# Patient Record
Sex: Female | Born: 1980 | Race: Black or African American | Hispanic: No | Marital: Single | Smoking: Light tobacco smoker
Health system: Southern US, Community
[De-identification: ages and names within clinical notes are randomized; demographics above are authoritative.]

## PROBLEM LIST (undated history)

## (undated) DIAGNOSIS — F319 Bipolar disorder, unspecified: Secondary | ICD-10-CM

## (undated) DIAGNOSIS — F209 Schizophrenia, unspecified: Secondary | ICD-10-CM

---

## 2008-07-04 ENCOUNTER — Emergency Department: Payer: Self-pay | Admitting: Emergency Medicine

## 2014-02-13 ENCOUNTER — Emergency Department: Payer: Self-pay | Admitting: Student

## 2014-02-13 LAB — COMPREHENSIVE METABOLIC PANEL
ANION GAP: 12 (ref 7–16)
Albumin: 4 g/dL (ref 3.4–5.0)
Alkaline Phosphatase: 55 U/L
BUN: 5 mg/dL — ABNORMAL LOW (ref 7–18)
Bilirubin,Total: 0.8 mg/dL (ref 0.2–1.0)
CHLORIDE: 101 mmol/L (ref 98–107)
Calcium, Total: 8.8 mg/dL (ref 8.5–10.1)
Co2: 24 mmol/L (ref 21–32)
Creatinine: 1 mg/dL (ref 0.60–1.30)
EGFR (African American): >60
GLUCOSE: 92 mg/dL (ref 65–99)
Osmolality: 271 (ref 275–301)
Potassium: 3.3 mmol/L — ABNORMAL LOW (ref 3.5–5.1)
SGOT(AST): 46 U/L — ABNORMAL HIGH (ref 15–37)
SGPT (ALT): 55 U/L
SODIUM: 137 mmol/L (ref 136–145)
Total Protein: 8.3 g/dL — ABNORMAL HIGH (ref 6.4–8.2)

## 2014-02-13 LAB — CBC
HCT: 42.3 % (ref 35.0–47.0)
HGB: 13.9 g/dL (ref 12.0–16.0)
MCH: 33.5 pg (ref 26.0–34.0)
MCHC: 32.8 g/dL (ref 32.0–36.0)
MCV: 102 fL — AB (ref 80–100)
Platelet: 320 10*3/uL (ref 150–440)
RBC: 4.14 10*6/uL (ref 3.80–5.20)
RDW: 14.7 % — AB (ref 11.5–14.5)
WBC: 9.7 10*3/uL (ref 3.6–11.0)

## 2014-02-13 LAB — SALICYLATE LEVEL: Salicylates, Serum: 3.2 mg/dL — ABNORMAL HIGH

## 2014-02-13 LAB — ACETAMINOPHEN LEVEL

## 2014-02-13 LAB — ETHANOL: Ethanol: <3 mg/dL

## 2014-02-14 LAB — DRUG SCREEN, URINE
Amphetamines, Ur Screen: NEGATIVE (ref ?–1000)
BENZODIAZEPINE, UR SCRN: NEGATIVE (ref ?–200)
Barbiturates, Ur Screen: NEGATIVE (ref ?–200)
Cannabinoid 50 Ng, Ur ~~LOC~~: POSITIVE (ref ?–50)
Cocaine Metabolite,Ur ~~LOC~~: NEGATIVE (ref ?–300)
MDMA (ECSTASY) UR SCREEN: NEGATIVE (ref ?–500)
METHADONE, UR SCREEN: NEGATIVE (ref ?–300)
OPIATE, UR SCREEN: NEGATIVE (ref ?–300)
Phencyclidine (PCP) Ur S: NEGATIVE (ref ?–25)
Tricyclic, Ur Screen: NEGATIVE (ref ?–1000)

## 2014-04-07 LAB — COMPREHENSIVE METABOLIC PANEL
ALK PHOS: 57 U/L
Albumin: 4 g/dL (ref 3.4–5.0)
Anion Gap: 11 (ref 7–16)
BUN: 11 mg/dL (ref 7–18)
Bilirubin,Total: 0.7 mg/dL (ref 0.2–1.0)
CHLORIDE: 102 mmol/L (ref 98–107)
CREATININE: 1.1 mg/dL (ref 0.60–1.30)
Calcium, Total: 9.4 mg/dL (ref 8.5–10.1)
Co2: 25 mmol/L (ref 21–32)
EGFR (African American): >60
EGFR (Non-African Amer.): >60
GLUCOSE: 92 mg/dL (ref 65–99)
OSMOLALITY: 275 (ref 275–301)
Potassium: 3.2 mmol/L — ABNORMAL LOW (ref 3.5–5.1)
SGOT(AST): 34 U/L (ref 15–37)
SGPT (ALT): 28 U/L
Sodium: 138 mmol/L (ref 136–145)
TOTAL PROTEIN: 8.6 g/dL — AB (ref 6.4–8.2)

## 2014-04-07 LAB — CBC
HCT: 44.5 % (ref 35.0–47.0)
HGB: 14.7 g/dL (ref 12.0–16.0)
MCH: 33.9 pg (ref 26.0–34.0)
MCHC: 32.9 g/dL (ref 32.0–36.0)
MCV: 103 fL — ABNORMAL HIGH (ref 80–100)
Platelet: 321 10*3/uL (ref 150–440)
RBC: 4.32 10*6/uL (ref 3.80–5.20)
RDW: 14.1 % (ref 11.5–14.5)
WBC: 11.3 10*3/uL — AB (ref 3.6–11.0)

## 2014-04-07 LAB — SALICYLATE LEVEL: Salicylates, Serum: 4.6 mg/dL — ABNORMAL HIGH

## 2014-04-07 LAB — TSH: THYROID STIMULATING HORM: 3.97 u[IU]/mL

## 2014-04-07 LAB — ACETAMINOPHEN LEVEL: Acetaminophen: <2 ug/mL

## 2014-04-07 LAB — ETHANOL

## 2014-04-08 ENCOUNTER — Inpatient Hospital Stay: Payer: Self-pay | Admitting: Psychiatry

## 2014-04-09 LAB — URINALYSIS, COMPLETE
BLOOD: NEGATIVE
Bacteria: NONE SEEN
Bilirubin,UR: NEGATIVE
Glucose,UR: NEGATIVE mg/dL (ref 0–75)
Ketone: NEGATIVE
LEUKOCYTE ESTERASE: NEGATIVE
NITRITE: NEGATIVE
PH: 6 (ref 4.5–8.0)
Protein: NEGATIVE
Specific Gravity: 1.015 (ref 1.003–1.030)
Squamous Epithelial: 1
WBC UR: 2 /HPF (ref 0–5)

## 2014-04-09 LAB — DRUG SCREEN, URINE
Amphetamines, Ur Screen: NEGATIVE (ref ?–1000)
Barbiturates, Ur Screen: NEGATIVE (ref ?–200)
Benzodiazepine, Ur Scrn: NEGATIVE (ref ?–200)
CANNABINOID 50 NG, UR ~~LOC~~: POSITIVE (ref ?–50)
COCAINE METABOLITE, UR ~~LOC~~: NEGATIVE (ref ?–300)
MDMA (Ecstasy)Ur Screen: NEGATIVE (ref ?–500)
Methadone, Ur Screen: NEGATIVE (ref ?–300)
Opiate, Ur Screen: NEGATIVE (ref ?–300)
PHENCYCLIDINE (PCP) UR S: NEGATIVE (ref ?–25)
Tricyclic, Ur Screen: NEGATIVE (ref ?–1000)

## 2014-04-09 LAB — RAPID HIV SCREEN (HIV 1/2 AB+AG)

## 2014-04-09 LAB — PREGNANCY, URINE: Pregnancy Test, Urine: NEGATIVE m[IU]/mL

## 2014-05-07 LAB — URINALYSIS, COMPLETE
BILIRUBIN, UR: NEGATIVE
BLOOD: NEGATIVE
GLUCOSE, UR: NEGATIVE mg/dL (ref 0–75)
LEUKOCYTE ESTERASE: NEGATIVE
NITRITE: NEGATIVE
Ph: 7 (ref 4.5–8.0)
Protein: NEGATIVE
RBC,UR: 1 /HPF (ref 0–5)
SPECIFIC GRAVITY: 1.018 (ref 1.003–1.030)
Squamous Epithelial: 3

## 2014-05-07 LAB — ACETAMINOPHEN LEVEL: Acetaminophen: <2 ug/mL

## 2014-05-07 LAB — COMPREHENSIVE METABOLIC PANEL
ALK PHOS: 50 U/L
Albumin: 4.2 g/dL (ref 3.4–5.0)
Anion Gap: 8 (ref 7–16)
BUN: 5 mg/dL — ABNORMAL LOW (ref 7–18)
Bilirubin,Total: 0.4 mg/dL (ref 0.2–1.0)
CALCIUM: 9 mg/dL (ref 8.5–10.1)
CREATININE: 0.96 mg/dL (ref 0.60–1.30)
Chloride: 107 mmol/L (ref 98–107)
Co2: 25 mmol/L (ref 21–32)
EGFR (Non-African Amer.): >60
Glucose: 100 mg/dL — ABNORMAL HIGH (ref 65–99)
OSMOLALITY: 277 (ref 275–301)
POTASSIUM: 3.6 mmol/L (ref 3.5–5.1)
SGOT(AST): 25 U/L (ref 15–37)
SGPT (ALT): 19 U/L
SODIUM: 140 mmol/L (ref 136–145)
TOTAL PROTEIN: 8.4 g/dL — AB (ref 6.4–8.2)

## 2014-05-07 LAB — DRUG SCREEN, URINE
Amphetamines, Ur Screen: NEGATIVE (ref ?–1000)
Barbiturates, Ur Screen: NEGATIVE (ref ?–200)
Benzodiazepine, Ur Scrn: NEGATIVE (ref ?–200)
Cannabinoid 50 Ng, Ur ~~LOC~~: POSITIVE (ref ?–50)
Cocaine Metabolite,Ur ~~LOC~~: NEGATIVE (ref ?–300)
MDMA (Ecstasy)Ur Screen: NEGATIVE (ref ?–500)
Methadone, Ur Screen: NEGATIVE (ref ?–300)
OPIATE, UR SCREEN: NEGATIVE (ref ?–300)
PHENCYCLIDINE (PCP) UR S: NEGATIVE (ref ?–25)
TRICYCLIC, UR SCREEN: NEGATIVE (ref ?–1000)

## 2014-05-07 LAB — CBC
HCT: 40.5 % (ref 35.0–47.0)
HGB: 13.3 g/dL (ref 12.0–16.0)
MCH: 33.4 pg (ref 26.0–34.0)
MCHC: 32.9 g/dL (ref 32.0–36.0)
MCV: 102 fL — ABNORMAL HIGH (ref 80–100)
PLATELETS: 323 10*3/uL (ref 150–440)
RBC: 3.98 10*6/uL (ref 3.80–5.20)
RDW: 13.7 % (ref 11.5–14.5)
WBC: 10.9 10*3/uL (ref 3.6–11.0)

## 2014-05-07 LAB — SALICYLATE LEVEL: Salicylates, Serum: 4.3 mg/dL — ABNORMAL HIGH

## 2014-05-07 LAB — ETHANOL: Ethanol: <3 mg/dL

## 2014-05-08 ENCOUNTER — Inpatient Hospital Stay: Payer: Self-pay | Admitting: Psychiatry
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
## 2014-08-30 NOTE — Consult Note (Signed)
PATIENT NAME:  Jody Eaton, Jody Eaton MR#:  [id number] DATE OF BIRTH:  1981/03/15  DATE OF CONSULTATION:  02/14/2014  REFERRING PHYSICIAN:   CONSULTING PHYSICIAN:  Audery AmelJohn T. Clapacs, MD  IDENTIFYING INFORMATION AND REASON FOR CONSULTATION: A 34 year old woman with unclear past psychiatric history brought into the hospital under involuntary commitment filed by her sister.   CHIEF COMPLAINT: "There's nothing wrong."   HISTORY OF PRESENT ILLNESS: Information obtained from the patient and the chart. Commitment paperwork says that the patient was pacing around both in the house and up and down the street, acting strangely, would not respond when spoken to, seemed to be out of her head. I tried calling the sister to get more detail and was unable to reach her. The patient when she came into the Emergency Room last night was apparently agitated and unresponsive and was eventually given IM Geodon and other medicines which knocked her out such that I was not able to talk to her last night. Today the patient tells me that she does not remember any reason why she is here. She says that she was just taking a walk because she felt like it and that the police came in and put in handcuffs on her. She cannot come up with any other explanation than that. She admits that she has been under a lot of stress, but does not want to go into much detail about it. She says she has not been sleeping well the last couple of days. Appetite has been okay. Denies that she has been feeling depressed, denies suicidal ideation, denies homicidal ideation. Denies that she is having hallucinations or psychotic symptoms. She tends to minimize everything about her situation. She has lost her job in the last month and gives me very little detail about that. She admits that she uses marijuana, but will not go into any detail about how frequently, denies other drugs. Does not think she has a substance abuse problem. Not currently getting any psychiatric  treatment. Seems to have just been decompensating a little bit over the last month, although it is hard to tell from her history, and then acutely bad over the last day.   PAST PSYCHIATRIC HISTORY: Apparently about 4 years ago there was a very similar episode. The patient does not remember where it happened and we do not have any details of it here, but it is mentioned by both the patient and the referral that several years ago she was evaluated briefly, but was not given any psychiatric diagnosis. Does not think she has ever been on any psychiatric medicines or been in a psychiatric hospital.   FAMILY HISTORY: Denies any family history of mental illness.   SUBSTANCE ABUSE HISTORY: Admits that she uses marijuana, but is somewhat evasive about the frequency of it. Denies other drug use. Denies drinking regularly.   SOCIAL HISTORY: Living with her sister and 2 children. The patient is not currently working. She did have a job up until about a month ago, but missed some work from being sick and lost her job. Has not even been looking for work since then, which she does not seem to see as being peculiar at all.   PAST MEDICAL HISTORY: Denies any known ongoing significant medical problems. Not on any medications.   ALLERGIES:  No known drug allergies.   REVIEW OF SYSTEMS: Denies depression. Denies mania. Denies suicidal or homicidal ideation. Denies hallucinations. No physical complaints. The whole 9 point review of systems negative.   MENTAL  STATUS EXAMINATION: Slightly disheveled woman, looks her stated age. Passively cooperative. Eye contact intermittent. Psychomotor activity a little bit fidgety, but not to a remarkable degree. Speech is decreased in total amount. Clearly tries to answer questions with as few words and in as limited a way as possible. Easy to understand the speech itself. Thoughts are again fairly simply expressed. Will not elaborate about much. Does not appear to be delusional or  have any bizarre thoughts. Denies hallucinations. Denies suicidal or homicidal ideation. She could recall 3 out of 3 objects immediately and at 3 minutes. She was alert and oriented x 4. Judgment and insight questionable, but currently seems able to make decisions about herself. Normal fund of knowledge.   LABORATORY RESULTS: Chemistry panel, slightly low BUN at 5, potassium slightly low at 3.3, AST elevated at 46, total protein elevated at 8.3. Alcohol level negative. CBC is all normal. Salicylates elevated at 3.2. The drug screen is positive for marijuana.   VITAL SIGNS:  Current blood pressure is 95/74, respirations 18, pulse 92, temperature 98.   The patient does not seem to be in any physical distress. Able to ambulate without difficulty, use all extremities. Cranial nerves symmetric.   ASSESSMENT: This is a 34 year old woman who came in, in some kind of dissociative or psychotic state yesterday which seemed to be fairly acute onset, but may be part of a longer period of stress. She slept overnight and now is not reporting any acute symptoms. She appears to be somewhat evasive, but there is no indication currently of any dangerousness to herself or others. She has what seems like pretty superficial insight about how strange her behavior is. No longer however is she committable. She will be released from the Emergency Room. I tried to do some psychoeducation with her and strongly encouraged her to follow up to see a therapist about her stress management. Discussed the case with the Emergency Room doctor. No indication for new medicine.   DIAGNOSIS PRINCIPAL AND PRIMARY:   AXIS I: Psychosis not otherwise specified.   SECONDARY DIAGNOSES:   AXIS I: Marijuana abuse.   AXIS II: Deferred.   AXIS III: No diagnosis.     ____________________________ Audery Amel, MD jtc:bu D: 02/14/2014 16:52:04 ET T: 02/14/2014 17:32:28 ET JOB#: [id number]  cc: Audery Amel, MD, <Dictator> Audery Amel MD ELECTRONICALLY SIGNED 02/17/2014 0:23

## 2014-08-30 NOTE — Consult Note (Signed)
Brief Consult Note: Diagnosis: psychosis nos.   Patient was seen by consultant.   Recommend further assessment or treatment.   Comments: PSychiatry: Chart reviewed and case discussed with intake coordinator. Patient is said to have presented with agitation and weird behavior. Unknown past history. Drug screen not back yet. Patient has been given several doses of medication and is too sedated to communicate. Will reassess tomorrow.  Electronic Signatures: Audery Amellapacs, Temesha Queener T (MD)  (Signed 08-Oct-15 17:13)  Authored: Brief Consult Note   Last Updated: 08-Oct-15 17:13 by Audery Amellapacs, Deisy Ozbun T (MD)
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
## 2016-05-01 ENCOUNTER — Inpatient Hospital Stay
Admission: EM | Admit: 2016-05-01 | Discharge: 2016-05-05 | DRG: 885 | Disposition: A | Discharge status: Home / Self Care | Payer: No Typology Code available for payment source | Source: Intra-hospital | Attending: Psychiatry | Admitting: Psychiatry

## 2016-05-01 ENCOUNTER — Encounter: Payer: Self-pay | Admitting: General Practice

## 2016-05-01 ENCOUNTER — Emergency Department
Admission: EM | Admit: 2016-05-01 | Discharge: 2016-05-01 | Disposition: A | Discharge status: Other Acute Inpatient Hospital | Payer: Self-pay | Attending: Emergency Medicine | Admitting: Emergency Medicine

## 2016-05-01 ENCOUNTER — Encounter: Payer: Self-pay | Admitting: Emergency Medicine

## 2016-05-01 DIAGNOSIS — F315 Bipolar disorder, current episode depressed, severe, with psychotic features: Principal | ICD-10-CM | POA: Diagnosis present

## 2016-05-01 DIAGNOSIS — F17200 Nicotine dependence, unspecified, uncomplicated: Secondary | ICD-10-CM | POA: Diagnosis present

## 2016-05-01 DIAGNOSIS — F209 Schizophrenia, unspecified: Secondary | ICD-10-CM | POA: Insufficient documentation

## 2016-05-01 DIAGNOSIS — Z91128 Patient's intentional underdosing of medication regimen for other reason: Secondary | ICD-10-CM

## 2016-05-01 DIAGNOSIS — T43226A Underdosing of selective serotonin reuptake inhibitors, initial encounter: Secondary | ICD-10-CM | POA: Diagnosis present

## 2016-05-01 DIAGNOSIS — Z9119 Patient's noncompliance with other medical treatment and regimen: Secondary | ICD-10-CM | POA: Diagnosis not present

## 2016-05-01 DIAGNOSIS — Z5181 Encounter for therapeutic drug level monitoring: Secondary | ICD-10-CM | POA: Insufficient documentation

## 2016-05-01 DIAGNOSIS — Z818 Family history of other mental and behavioral disorders: Secondary | ICD-10-CM | POA: Diagnosis not present

## 2016-05-01 DIAGNOSIS — T434X6A Underdosing of butyrophenone and thiothixene neuroleptics, initial encounter: Secondary | ICD-10-CM | POA: Diagnosis present

## 2016-05-01 DIAGNOSIS — F23 Brief psychotic disorder: Secondary | ICD-10-CM

## 2016-05-01 DIAGNOSIS — F319 Bipolar disorder, unspecified: Secondary | ICD-10-CM | POA: Diagnosis not present

## 2016-05-01 HISTORY — DX: Bipolar disorder, unspecified: F31.9

## 2016-05-01 HISTORY — DX: Schizophrenia, unspecified: F20.9

## 2016-05-01 LAB — CBC
HCT: 43 % (ref 35.0–47.0)
Hemoglobin: 14.3 g/dL (ref 12.0–16.0)
MCH: 32.4 pg (ref 26.0–34.0)
MCHC: 33.4 g/dL (ref 32.0–36.0)
MCV: 97.1 fL (ref 80.0–100.0)
PLATELETS: 337 10*3/uL (ref 150–440)
RBC: 4.43 MIL/uL (ref 3.80–5.20)
RDW: 14.1 % (ref 11.5–14.5)
WBC: 9.1 10*3/uL (ref 3.6–11.0)

## 2016-05-01 LAB — COMPREHENSIVE METABOLIC PANEL
ALT: 11 U/L — AB (ref 14–54)
AST: 16 U/L (ref 15–41)
Albumin: 4.1 g/dL (ref 3.5–5.0)
Alkaline Phosphatase: 51 U/L (ref 38–126)
Anion gap: 10 (ref 5–15)
BUN: 8 mg/dL (ref 6–20)
CO2: 24 mmol/L (ref 22–32)
CREATININE: 0.91 mg/dL (ref 0.44–1.00)
Calcium: 9.7 mg/dL (ref 8.9–10.3)
Chloride: 104 mmol/L (ref 101–111)
Glucose, Bld: 111 mg/dL — ABNORMAL HIGH (ref 65–99)
Potassium: 3 mmol/L — ABNORMAL LOW (ref 3.5–5.1)
Sodium: 138 mmol/L (ref 135–145)
TOTAL PROTEIN: 7.7 g/dL (ref 6.5–8.1)
Total Bilirubin: 0.5 mg/dL (ref 0.3–1.2)

## 2016-05-01 LAB — URINE DRUG SCREEN, QUALITATIVE (ARMC ONLY)
Amphetamines, Ur Screen: NOT DETECTED
BARBITURATES, UR SCREEN: NOT DETECTED
Benzodiazepine, Ur Scrn: NOT DETECTED
CANNABINOID 50 NG, UR ~~LOC~~: POSITIVE — AB
Cocaine Metabolite,Ur ~~LOC~~: NOT DETECTED
MDMA (ECSTASY) UR SCREEN: NOT DETECTED
Methadone Scn, Ur: NOT DETECTED
Opiate, Ur Screen: NOT DETECTED
PHENCYCLIDINE (PCP) UR S: NOT DETECTED
TRICYCLIC, UR SCREEN: NOT DETECTED

## 2016-05-01 LAB — PREGNANCY, URINE: PREG TEST UR: NEGATIVE

## 2016-05-01 LAB — ACETAMINOPHEN LEVEL: Acetaminophen (Tylenol), Serum: <10 ug/mL — ABNORMAL LOW (ref 10–30)

## 2016-05-01 LAB — SALICYLATE LEVEL

## 2016-05-01 LAB — ETHANOL

## 2016-05-01 MED ORDER — DIPHENHYDRAMINE HCL 50 MG/ML IJ SOLN
50.0000 mg | Freq: Three times a day (TID) | INTRAMUSCULAR | Status: DC | PRN
Start: 2016-05-01 — End: 2016-05-05

## 2016-05-01 MED ORDER — BENZTROPINE MESYLATE 1 MG/ML IJ SOLN
1.0000 mg | Freq: Four times a day (QID) | INTRAMUSCULAR | Status: DC | PRN
  Filled 2016-05-01: qty 1

## 2016-05-01 MED ORDER — DIPHENHYDRAMINE HCL 25 MG PO CAPS
50.0000 mg | ORAL_CAPSULE | Freq: Three times a day (TID) | ORAL | Status: DC | PRN
  Administered 2016-05-01: 50 mg via ORAL
  Filled 2016-05-01: qty 2

## 2016-05-01 MED ORDER — LORAZEPAM 2 MG PO TABS: 2.0000 mg | ORAL_TABLET | ORAL | Status: DC | PRN

## 2016-05-01 MED ORDER — HALOPERIDOL LACTATE 5 MG/ML IJ SOLN: 5.0000 mg | Freq: Three times a day (TID) | INTRAMUSCULAR | Status: DC | PRN

## 2016-05-01 MED ORDER — LORAZEPAM 2 MG/ML IJ SOLN: 2.0000 mg | INTRAMUSCULAR | Status: DC | PRN

## 2016-05-01 MED ORDER — ACETAMINOPHEN 325 MG PO TABS
650.0000 mg | ORAL_TABLET | Freq: Four times a day (QID) | ORAL | Status: DC | PRN
Start: 2016-05-01 — End: 2016-05-05

## 2016-05-01 MED ORDER — OLANZAPINE 10 MG IM SOLR
5.0000 mg | Freq: Four times a day (QID) | INTRAMUSCULAR | Status: DC | PRN
Start: 2016-05-01 — End: 2016-05-01

## 2016-05-01 MED ORDER — MAGNESIUM HYDROXIDE 400 MG/5ML PO SUSP: 30.0000 mL | Freq: Every day | ORAL | Status: DC | PRN

## 2016-05-01 MED ORDER — ALUM & MAG HYDROXIDE-SIMETH 200-200-20 MG/5ML PO SUSP: 30.0000 mL | ORAL | Status: DC | PRN

## 2016-05-01 MED ORDER — OLANZAPINE 10 MG PO TABS
10.0000 mg | ORAL_TABLET | ORAL | Status: AC
  Administered 2016-05-01: 10 mg via ORAL
  Filled 2016-05-01 (×2): qty 1

## 2016-05-01 MED ORDER — HALOPERIDOL 5 MG PO TABS
5.0000 mg | ORAL_TABLET | Freq: Three times a day (TID) | ORAL | Status: DC | PRN
  Administered 2016-05-01: 5 mg via ORAL
  Filled 2016-05-01: qty 1

## 2016-05-01 NOTE — Plan of Care (Signed)
Problem: Safety: Goal: Ability to remain free from injury will improve Pt denies SI and A/V hallucinations

## 2016-05-01 NOTE — ED Notes (Signed)
Pt pulled BR cord again and stated that she needs help and pointed to the writing on the pull cord that says pull if you need help.

## 2016-05-01 NOTE — ED Notes (Signed)
Pt found naked in room by RN's April and Dawn. Pt is cooperative and dressed.

## 2016-05-01 NOTE — Tx Team (Signed)
Initial Treatment Plan 05/01/2016 5:37 PM Jody Eaton ZOX:[id number]    PATIENT STRESSORS: Financial difficulties Health problems Medication change or noncompliance   PATIENT STRENGTHS: Barrister's clerkCommunication skills Motivation for treatment/growth Physical Health   PATIENT IDENTIFIED PROBLEMS: suicidal  depresssion                   DISCHARGE CRITERIA:  Adequate post-discharge living arrangements Improved stabilization in mood, thinking, and/or behavior Motivation to continue treatment in a less acute level of care  PRELIMINARY DISCHARGE PLAN: Participate in family therapy Return to previous living arrangement Return to previous work or school arrangements  PATIENT/FAMILY INVOLVEMENT: This treatment plan has been presented to and reviewed with the patient, Jody [name], and/or family member.  The patient and family have been given the opportunity to ask questions and make suggestions.  Hoover Brunettelifton B Kazi Montoro, RN 05/01/2016, 5:37 PM

## 2016-05-01 NOTE — ED Notes (Signed)
Pt walked down to ED BHU by this tech and ODS officer.

## 2016-05-01 NOTE — ED Notes (Signed)
Pt discharged to BMU lower level under IVC escorted by police and staff. All belongings and paperwork sent with pt.

## 2016-05-01 NOTE — ED Notes (Signed)
Psychiatry at bedside.

## 2016-05-01 NOTE — Consult Note (Signed)
[facility] Psychiatry Consult   Reason for Consult:  Worsening psychosis, hallucination, delusion Referring Physician: Dr.  Gwen Pounds Patient Identification: Jody Eaton MRN:  [id number] Principal Diagnosis:   Schizophrenia Diagnosis:  There are no active problems to display for this patient.   Total Time spent with patient: 45 min Subjective:   Jody Eaton is a 35 y.o. female  " I hit the lottery and they are after me"  HPI:    Jody Eaton is a 35 y.o. female with a history of psychiatric disease who presents to ED with  her mother for evaluation of bizarre behavior, medication noncompliance,hallucinations, and concern for the safety of the patient and others around her. She confirms that she does not take any medications.  She has been seen previously at [facility] .  Pt presents as psych tic with poor historian with disorganized speech. Pt was responding to internal stimuli. Pt  States that she  " I hit the lottery and they are after me" " I can hear them, see them and smell them" . She believes  they want to kill her. Pt  States she wants to "fight back".  Pt anxious, labile, guarded, responding to IS, laughing inappropriately. Denies SI.  Past Psychiatric History:  H/o depression  And psychosis, h/o psych hospitalization in 2015 twice at [facility], has h/o tx with Haldol and Celexa, Zyprexa Current Op at [facility], noncompliant with meds.  Denies suicidal attempt  Past Medical History:  Past Medical History:  Diagnosis Date  . Bipolar 1 disorder ([place])   . Schizophrenia ([place])    History reviewed. No pertinent surgical history. Family History: History reviewed. No pertinent family history. Family Psychiatric  History: none reported Social History:  History  Alcohol Use No     History  Drug Use No    Social History   Social History  . Marital status: Single    Spouse name: N/A  . Number of children: N/A  . Years of education: N/A   Social History Main Topics  .  Smoking status: Never Smoker  . Smokeless tobacco: Never Used  . Alcohol use No  . Drug use: No  . Sexual activity: Not Asked   Other Topics Concern  . None   Social History Narrative  . None   Additional Social History: unemployed, has h/o working with Web designer, has supportive family    Allergies:  No Known Allergies  Labs:  Results for orders placed or performed during the hospital encounter of 05/01/16 (from the past 48 hour(s))  Comprehensive metabolic panel     Status: Abnormal   Collection Time: 05/01/16  5:01 AM  Result Value Ref Range   Sodium 138 135 - 145 mmol/L   Potassium 3.0 (L) 3.5 - 5.1 mmol/L   Chloride 104 101 - 111 mmol/L   CO2 24 22 - 32 mmol/L   Glucose, Bld 111 (H) 65 - 99 mg/dL   BUN 8 6 - 20 mg/dL   Creatinine, Ser 0.91 0.44 - 1.00 mg/dL   Calcium 9.7 8.9 - 10.3 mg/dL   Total Protein 7.7 6.5 - 8.1 g/dL   Albumin 4.1 3.5 - 5.0 g/dL   AST 16 15 - 41 U/L   ALT 11 (L) 14 - 54 U/L   Alkaline Phosphatase 51 38 - 126 U/L   Total Bilirubin 0.5 0.3 - 1.2 mg/dL   GFR calc non Af Amer >60 >60 mL/min   GFR calc Af Amer >60 >60 mL/min  Comment: (NOTE) The eGFR has been calculated using the CKD EPI equation. This calculation has not been validated in all clinical situations. eGFR's persistently <60 mL/min signify possible Chronic Kidney Disease.    Anion gap 10 5 - 15  Ethanol     Status: None   Collection Time: 05/01/16  5:01 AM  Result Value Ref Range   Alcohol, Ethyl (B) <5 <5 mg/dL    Comment:        LOWEST DETECTABLE LIMIT FOR SERUM ALCOHOL IS 5 mg/dL FOR MEDICAL PURPOSES ONLY   Salicylate level     Status: None   Collection Time: 05/01/16  5:01 AM  Result Value Ref Range   Salicylate Lvl <6.3 2.8 - 30.0 mg/dL  Acetaminophen level     Status: Abnormal   Collection Time: 05/01/16  5:01 AM  Result Value Ref Range   Acetaminophen (Tylenol), Serum <10 (L) 10 - 30 ug/mL    Comment:        THERAPEUTIC CONCENTRATIONS  VARY SIGNIFICANTLY. A RANGE OF 10-30 ug/mL MAY BE AN EFFECTIVE CONCENTRATION FOR MANY PATIENTS. HOWEVER, SOME ARE BEST TREATED AT CONCENTRATIONS OUTSIDE THIS RANGE. ACETAMINOPHEN CONCENTRATIONS >150 ug/mL AT 4 HOURS AFTER INGESTION AND >50 ug/mL AT 12 HOURS AFTER INGESTION ARE OFTEN ASSOCIATED WITH TOXIC REACTIONS.   cbc     Status: None   Collection Time: 05/01/16  5:01 AM  Result Value Ref Range   WBC 9.1 3.6 - 11.0 K/uL   RBC 4.43 3.80 - 5.20 MIL/uL   Hemoglobin 14.3 12.0 - 16.0 g/dL   HCT 43.0 35.0 - 47.0 %   MCV 97.1 80.0 - 100.0 fL   MCH 32.4 26.0 - 34.0 pg   MCHC 33.4 32.0 - 36.0 g/dL   RDW 14.1 11.5 - 14.5 %   Platelets 337 150 - 440 K/uL  Urine Drug Screen, Qualitative     Status: Abnormal   Collection Time: 05/01/16  6:37 AM  Result Value Ref Range   Tricyclic, Ur Screen NONE DETECTED NONE DETECTED   Amphetamines, Ur Screen NONE DETECTED NONE DETECTED   MDMA (Ecstasy)Ur Screen NONE DETECTED NONE DETECTED   Cocaine Metabolite,Ur [place] NONE DETECTED NONE DETECTED   Opiate, Ur Screen NONE DETECTED NONE DETECTED   Phencyclidine (PCP) Ur S NONE DETECTED NONE DETECTED   Cannabinoid 50 Ng, Ur [place] POSITIVE (A) NONE DETECTED   Barbiturates, Ur Screen NONE DETECTED NONE DETECTED   Benzodiazepine, Ur Scrn NONE DETECTED NONE DETECTED   Methadone Scn, Ur NONE DETECTED NONE DETECTED    Comment: (NOTE) 335  Tricyclics, urine               Cutoff 1000 ng/mL 200  Amphetamines, urine             Cutoff 1000 ng/mL 300  MDMA (Ecstasy), urine           Cutoff 500 ng/mL 400  Cocaine Metabolite, urine       Cutoff 300 ng/mL 500  Opiate, urine                   Cutoff 300 ng/mL 600  Phencyclidine (PCP), urine      Cutoff 25 ng/mL 700  Cannabinoid, urine              Cutoff 50 ng/mL 800  Barbiturates, urine             Cutoff 200 ng/mL 900  Benzodiazepine, urine           Cutoff  200 ng/mL 1000 Methadone, urine                Cutoff 300 ng/mL 1100 1200 The urine drug screen provides  only a preliminary, unconfirmed 1300 analytical test result and should not be used for non-medical 1400 purposes. Clinical consideration and professional judgment should 1500 be applied to any positive drug screen result due to possible 1600 interfering substances. A more specific alternate chemical method 1700 must be used in order to obtain a confirmed analytical result.  1800 Gas chromato graphy / mass spectrometry (GC/MS) is the preferred 1900 confirmatory method.   Pregnancy, urine     Status: None   Collection Time: 05/01/16  6:37 AM  Result Value Ref Range   Preg Test, Ur NEGATIVE NEGATIVE    No current facility-administered medications for this encounter.    No current outpatient prescriptions on file.    Musculoskeletal: Strength & Muscle Tone: within normal limits Gait & Station: normal Patient leans: N/A  Psychiatric Specialty Exam: Physical Exam  Constitutional: She appears well-developed and well-nourished.    Review of Systems  Psychiatric/Behavioral: Positive for hallucinations. The patient is nervous/anxious.     Blood pressure (!) 128/96, pulse (!) 105, temperature 98.4 F (36.9 C), temperature source Oral, resp. rate 18, height 5' 7"  (1.702 m), weight 63.5 kg (140 lb), SpO2 96 %.Body mass index is 21.93 kg/m.  General Appearance: Disheveled  Eye Contact:  Poor  Speech:  disorganized  Volume:  variable  Mood:  labile  Affect:  Labile, incongruent  Thought Process:  illogical  Orientation:  ox3  Thought Content:  Paranoid, delusional  Suicidal Thoughts:  denies  Homicidal Thoughts:  Thoughts to hurt the people who are after her  Memory:  intact  Judgement:  poor  Insight- poor  Psychomotor Activity:  normal  Concentration:  Easily distractible, responding to IS  Recall:  Appears intact  Fund of Knowledge:  average  Language:  intact  Akathisia:  none  Handed:    AIMS (if indicated):     Assets:    ADL's:  poor  Cognition:  Impaired  concentration  Sleep:        Treatment Plan Summary: Pt is currently very psychotic, delusional, having hallucination. Pt having thoughts of hurting people who are after her money. Pt is danger to others/self, unable;e to take care self. Recommend- 1. Psych inpatient  2. Cont current psych meds   Disposition:  Psych hospitalization   Lenward Chancellor, MD 05/01/2016 11:53 AM
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
## 2016-05-01 NOTE — Progress Notes (Addendum)
Pt admitted to behavior with the Dx. Of Bipolar DO.According to report :  Pt presented [facility] to [facility] ED BIB her mom. Pt sts she lives w/ her mom and 36 yo son. Pt has two other teenage children. Pt is cooperative and oriented to person, place, date but not situation. Pt is poor historian as she doesn't answer questions at times and appear not to understand certain questions. Pt sts she is in ED because "I'm having bad thoughts." She then says, "Durwin Glazeverbody is trying to kill me." Pt denies SI currently or at any time in the past. Pt denies any history of suicide attempts, or of self-mutilation. She says that she is thinking of killing her husband and children by setting fire to them. Pt says that she knows that thoughts of harming her family are "bizarre". Pt denies intent. She sts she doesn't want to harm anyone. Pt denies she takes any meds. Later, she says that [facility]. She endorses AH with command. She sts the voices tell her to kill her husband and children. She reports that she is hearing the voices during the teleassessment. 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 Per chart review, pt was fired in Aug 2017 after charge of reckless driving which resulted from delusion of people chasing her. Pt sts, "My whole ass is one fire" when asked about medical conditions. Pt has no upcoming court dates. Per chart review, pt was [facility] for psychosis. Pt denies substance abuse. Pt endorses tearfulness but denies depressive mood. Pt was cooperative with the admission process to the BMU. Pt denies si, Hi and A/V hallucinations although pt did appear to be responding to internal stimuli . Will place close to the nurses station to better ensure pt safety.

## 2016-05-01 NOTE — ED Notes (Signed)
Pair of gold-colored earrings with clear stone given to pt's mother

## 2016-05-01 NOTE — Progress Notes (Signed)
LCSW called TTS and [place] worker is Page, She will to a tele assessment in the BHU and then she will transfer to inpatient  BMU- room #325A Patient accepted by Eugenia Mcalpineliff BMU charge nurse  Arrie Senatelaudine Caelynn Marshman LCSW [phone]

## 2016-05-01 NOTE — ED Provider Notes (Signed)
[facility] Emergency Department Provider Note  ____________________________________________   First MD Initiated Contact with Patient 05/01/16 [phone]     (approximate)  I have reviewed the triage vital signs and the nursing notes.   HISTORY  Chief Complaint Medical Clearance  History is limited by   HPI Jody Eaton is a 35 y.o. female with a history of psychiatric disease who presents in the company of her mother for evaluation of bizarre behavior, medication noncompliance,hallucinations, and concern for the safety of the patient and others around her.  The patient's mood and affect is very labile in the.  She initially was smiling broadly to everyone but not speaking.  She then began speaking but only "yes" and "no".  She then broke into tears and started crying about all of the heat in the world.  During my evaluation she started out smiling but staring off into the distance and clearly responding to internal stimuli.  I ask her if she was hearing voices and she said yes.  I asked her what the voices were telling her and she looked at me and said "they are telling me to kill everyone."  She was still smiling.  But when I ask her if she was also seeing things she started to cry and said "yes, all kinds of things."  She does not answer when asked if she has any thoughts of killing herself.  She confirms that she does not take any medications.  She has [facility] according to her mother.  No other specific details are available at this time.   Past Medical History:  Diagnosis Date  . Bipolar 1 disorder (HCC)   . Schizophrenia (HCC)     There are no active problems to display for this patient.   History reviewed. No pertinent surgical history.  Prior to Admission medications   Not on File    Allergies Patient has no known allergies.  History reviewed. No pertinent family history.  Social History Social History  Substance Use  Topics  . Smoking status: Never Smoker  . Smokeless tobacco: Never Used  . Alcohol use No    Review of Systems Unable to obtain due to acute psychosis  ____________________________________________   PHYSICAL EXAM:  VITAL SIGNS: ED Triage Vitals  Enc Vitals Group     BP 05/01/16 0458 (!) 148/100     Pulse Rate 05/01/16 0458 (!) 117     Resp 05/01/16 0458 18     Temp 05/01/16 0458 98.2 F (36.8 C)     Temp Source 05/01/16 0458 Oral     SpO2 05/01/16 0458 97 %     Weight 05/01/16 0459 140 lb (63.5 kg)     Height 05/01/16 0459 5\' 7"  (1.702 m)     Head Circumference --      Peak Flow --      Pain Score 05/01/16 0500 0     Pain Loc --      Pain Edu? --      Excl. in GC? --     Constitutional: Alert and oriented. Well appearing and in no acute distress. Eyes: Conjunctivae are normal. PERRL. EOMI. Head: Atraumatic. Nose: No congestion/rhinnorhea. Mouth/Throat: Mucous membranes are moist.  Oropharynx non-erythematous. Neck: No stridor.  No meningeal signs.   Cardiovascular: Normal rate, regular rhythm. Good peripheral circulation. Grossly normal heart sounds. Respiratory: Normal respiratory effort.  No retractions. Lungs CTAB. Gastrointestinal: Soft and nontender. No distention.  Musculoskeletal: No lower  extremity tenderness nor edema. No gross deformities of extremities. Neurologic:  Normal speech and language. No gross focal neurologic deficits are appreciated.  Skin:  Skin is warm, dry and intact. No rash noted. Psychiatric: Mood and affect are bizarre and labile.  Endorses violent and disturbing auditory and visual hallucinations.  Hearing voices telling her to "kill everyone".  ____________________________________________   LABS (all labs ordered are listed, but only abnormal results are displayed)  Labs Reviewed  COMPREHENSIVE METABOLIC PANEL - Abnormal; Notable for the following:       Result Value   Potassium 3.0 (*)    Glucose, Bld 111 (*)    ALT 11 (*)     All other components within normal limits  ACETAMINOPHEN LEVEL - Abnormal; Notable for the following:    Acetaminophen (Tylenol), Serum <10 (*)    All other components within normal limits  URINE DRUG SCREEN, QUALITATIVE ([facility] ONLY) - Abnormal; Notable for the following:    Cannabinoid 50 Ng, Ur [place] POSITIVE (*)    All other components within normal limits  ETHANOL  SALICYLATE LEVEL  CBC  PREGNANCY, URINE  POC URINE PREG, ED   ____________________________________________  EKG  None - EKG not ordered by ED physician ____________________________________________  RADIOLOGY   No results found.  ____________________________________________   PROCEDURES  Procedure(s) performed:   Procedures   Critical Care performed: No ____________________________________________   INITIAL IMPRESSION / ASSESSMENT AND PLAN / ED COURSE  Pertinent labs & imaging results that were available during my care of the patient were reviewed by me and considered in my medical decision making (see chart for details).  The patient is acutely psychotic and having extremely wide mood swings.  I will order some olanzapine see if she is willing to take it at this time but she has not immediately danger to others and I will not escalate the situation by providing intramuscular injection.  I told that we have on-site psychiatric evaluation available today and I have ordered consults.  I put her under involuntary commitment for her safety and that of others.  There is no evidence of acute emergent medical condition at this time.   Clinical Course     ____________________________________________  FINAL CLINICAL IMPRESSION(S) / ED DIAGNOSES  Final diagnoses:  Acute psychosis  Schizophrenia, unspecified type (HCC)     MEDICATIONS GIVEN DURING THIS VISIT:  Medications - No data to display   NEW OUTPATIENT MEDICATIONS STARTED DURING THIS VISIT:  New Prescriptions   No medications on file     Modified Medications   No medications on file    Discontinued Medications   No medications on file     Note:  This document was prepared using Dragon voice recognition software and may include unintentional dictation errors.    Loleta Roseory Elexius Minar, MD 05/01/16 [phone]

## 2016-05-01 NOTE — Progress Notes (Signed)
LCSW consulted ED RN and Psychiatrist gave him brief background. He will consult with patient and EDP and advise SW plan of care.    Jenniger Figiel LCSW

## 2016-05-01 NOTE — Progress Notes (Signed)
D: Patient is alert and oriented to person on the unit this shift. Patient not attended and actively participated in groups today. Patient denies suicidal ideation, homicidal ideation, has auditory  hallucinations at the present time and responds to internal stimuli.  A: Scheduled medications are administered to patient as per MD orders. Emotional support and encouragement are provided. Patient is maintained on q.15 minute safety checks. Patient is informed to notify staff with questions or concerns. R: No adverse medication reactions are noted. Patient is cooperative with medication administration  . Patient is receptive, calm and cooperative,pacing, responding to internal stimuli at this time ,hyperreligious. Haldol 5 mg given po tol well.  . Patient does not interact  with others on the unit this shift. Patient contracts for safety at this time. Patient remains safe at this time.

## 2016-05-01 NOTE — Progress Notes (Signed)
Agitated and psychotic. Orders for haldol, benadryl and ativan PO or IM prn. Review records from prior admission. Pt treated with haldol then

## 2016-05-01 NOTE — Plan of Care (Signed)
Problem: Safety: Goal: Ability to remain free from injury will improve Outcome: Not Progressing Patient pacing after medication administration , instructed her to lay down but keeps pacing around unit [place] RN

## 2016-05-01 NOTE — ED Notes (Addendum)
Mother states pt was taking several psych medications but was taken off them by [facility] sometime recently, unsure how long, only medication mother knows about is carbamazepin 20mg  bid.

## 2016-05-01 NOTE — ED Notes (Signed)
Pt 's bathroom emergency light on outside room. When this RN arrived in room, pt jumped back into bed quickly. Pt asked if she needed help and responded "yes". Pt then began crying telling this RN that all of the hate in the world made her heart hurt. Pt then clapped her hands and loudly said "Yes" when this RN changed the topic to birthdays.

## 2016-05-01 NOTE — ED Notes (Signed)
[facility]. Patient's affect and mood very labile. Nods her head while smiling when asked about SI and A/V hallucinations. Offered pt fluids. Encouraged pt to voice any concerns and ask questions. Pt remains safe with 15 minute checks.

## 2016-05-01 NOTE — ED Notes (Signed)
Pt's mother's contact info: Hilary HertzAngela Kallam [phone].  Mother given password [id number]

## 2016-05-01 NOTE — ED Triage Notes (Addendum)
Pt brought in by mother, ambulatory to triage, pt not speaking at this time, but very active in room with big smile.  Mother states pt has hx of schizophrenia and bipolar and has been off medications for unknown amount of time.  [facility].  Pt nods no when asked about SI and HI, pt nods yes when asked if she knows why she's here, pt nods yes when asked if she wants help.    Few minutes into triage, pt begins answering questions verbally, but then goes through non-verbal times as well.

## 2016-05-02 ENCOUNTER — Encounter: Payer: Self-pay | Admitting: Psychiatry

## 2016-05-02 DIAGNOSIS — F17200 Nicotine dependence, unspecified, uncomplicated: Secondary | ICD-10-CM

## 2016-05-02 DIAGNOSIS — F319 Bipolar disorder, unspecified: Secondary | ICD-10-CM

## 2016-05-02 MED ORDER — POTASSIUM CHLORIDE CRYS ER 20 MEQ PO TBCR
40.0000 meq | EXTENDED_RELEASE_TABLET | Freq: Every day | ORAL | Status: AC
  Administered 2016-05-02 – 2016-05-03 (×2): 40 meq via ORAL
  Filled 2016-05-02 (×2): qty 2

## 2016-05-02 MED ORDER — NICOTINE 21 MG/24HR TD PT24
21.0000 mg | MEDICATED_PATCH | Freq: Every day | TRANSDERMAL | Status: DC
  Administered 2016-05-03 – 2016-05-05 (×3): 21 mg via TRANSDERMAL
  Filled 2016-05-02 (×3): qty 1

## 2016-05-02 MED ORDER — DIPHENHYDRAMINE HCL 25 MG PO CAPS
50.0000 mg | ORAL_CAPSULE | Freq: Every day | ORAL | Status: DC
  Administered 2016-05-02 – 2016-05-04 (×3): 50 mg via ORAL
  Filled 2016-05-02 (×3): qty 2

## 2016-05-02 MED ORDER — HALOPERIDOL 5 MG PO TABS
5.0000 mg | ORAL_TABLET | Freq: Every day | ORAL | Status: DC
  Administered 2016-05-02 – 2016-05-04 (×3): 5 mg via ORAL
  Filled 2016-05-02 (×3): qty 1

## 2016-05-02 NOTE — Progress Notes (Signed)
Patient with depressed affect, labile behavior. Anxious and teary, arguing with mother at times on phone. Cooperative with meals, meds and plan of care. Her children are her motivation for treatment. No SI/HI at this time. Social with select female peers in appropriate way. Verbalizes needs appropriately with staff. Safety maintained.

## 2016-05-02 NOTE — [facility] Group Notes (Signed)
[facility] Group Notes:  (Nursing/MHT/Case Management/Adjunct)  Date:  05/02/2016  Time:  4:55 AM  Type of Therapy:  Psychoeducational Skills  Participation Level:  Did Not Attend   Summary of Progress/Problems:  Chancy MilroyLaquanda Y Sherwin Hollingshed 05/02/2016, 4:55 AM

## 2016-05-02 NOTE — H&P (Addendum)
Psychiatric Admission Assessment Adult  Patient Identification: Jody Eaton MRN:  [id number] Date of Evaluation:  05/02/2016 Chief Complaint:  Schizophrenia Principal Diagnosis: Bipolar I disorder ([place]) Diagnosis:   Patient Active Problem List   Diagnosis Date Noted  . Tobacco use disorder [F17.200] 05/02/2016  . Bipolar I disorder ([place]) [F31.9] 05/02/2016   History of Present Illness:  Jody Eaton is a 35 y.o. female with a history of bipolar vrs MDD with psychosis who presented in the company of her mother on 12/24 for evaluation of bizarre behavior, medication noncompliance,hallucinations, and concern for the safety of the patient and others around her.   Urine toxicology was only positive for cannabis. Alcohol level was below the detection limit  Per ER physician: "The patient's mood and affect is very labile in the.  She initially was smiling broadly to everyone but not speaking.  She then began speaking but only "yes" and "no".  She then broke into tears and started crying about all of the heat in the world.  During my evaluation she started out smiling but staring off into the distance and clearly responding to internal stimuli.  I ask her if she was hearing voices and she said yes.  I asked her what the voices were telling her and she looked at me and said "they are telling me to kill everyone."  She was still smiling.  But when I ask her if she was also seeing things she started to cry and said "yes, all kinds of things."  She does not answer when asked if she has any thoughts of killing herself.  She confirms that she does not take any medications.  She has been seen previously at [facility] according to her mother."    Per ER psychiatrist: "poor historian with disorganized speech. Pt was responding to internal stimuli. Pt  States that she  " I hit the lottery and they are after me" " I can hear them, see them and smell them" . She believes  they want to kill her. Pt  States she  wants to "fight back".  Pt anxious, labile, guarded, responding to IS, laughing inappropriately. Denies SI. "  Per review of records patient has been hospitalized here in the past he looks like her last admission was in 2015. At that time she had a diagnosis of major depressive disorder with psychosis. She is being treated with haloperidol and citalopram in the past.  Patient was only semicooperative with assessment. She is upset about being here in the hospital. She feels that there is absolutely no reason for her to be here.  Patient tells me today she has been going to [place] for treatment where she sees Dr. Jamse Arn. States she's been diagnosed with bipolar disorder. Says she is only prescribed with trazodone and cannot remember the names of other medications. Denies taking any injectables.  Patient states she is unaware as to why she was hospitalized. Says that her mother brought her in but she doesn't know why. She denies having any problems with her mood, appetite, energy is sleep or concentration. She denies any auditory or visual hallucinations. Denies any racing thoughts, increased energy or impulsivity.  Per the nurses last night the patient was acting very bizarre, she was a scaring other patients. The patient was hyperreligious and appeared to be interacting to internal stimuli. Orders were given for Haldol, Ativan and Benadryl by mouth or IM when necessary.  She denies any history of drug or alcohol use.  Patient  is requesting to be discharged. Says that she didn't want to come to the hospital and she didn't want to be here for Christmas.  Associated Signs/Symptoms: Depression Symptoms:  denies (Hypo) Manic Symptoms:  Hallucinations, Impulsivity, Anxiety Symptoms:  denies Psychotic Symptoms:  Hallucinations: Auditory PTSD Symptoms: NA Total Time spent with patient: 1 hour  Past Psychiatric History: Patient has been hospitalized in our unit before. She was giving a diagnosis of major  depressive disorder with psychotic features. She was last here in 2015. She didn't treated with Haldol and Celexa in the past. Says that she has been diagnosed with bipolar disorder and has been going to [place]. Per the information we received at admission patient has been noncompliant. Patient tells me she is only taking trazodone.   Is the patient at risk to self? Yes.    Has the patient been a risk to self in the past 6 months? No.  Has the patient been a risk to self within the distant past? No.  Is the patient a risk to others? No.  Has the patient been a risk to others in the past 6 months? No.  Has the patient been a risk to others within the distant past? No.    Alcohol Screening: 1. How often do you have a drink containing alcohol?: Never 9. Have you or someone else been injured as a result of your drinking?: No 10. Has a relative or friend or a doctor or another health worker been concerned about your drinking or suggested you cut down?: No Alcohol Use Disorder Identification Test Final Score (AUDIT): 0  Past Medical History:  Past Medical History:  Diagnosis Date  . Bipolar 1 disorder ([place])   . Schizophrenia ([place])    History reviewed. No pertinent surgical history.  Family History: History reviewed. No pertinent family history.  Family Psychiatric  History: Patient denies any history of mental illness in her family "I don't know"  Tobacco Screening: Have you used any form of tobacco in the last 30 days? (Cigarettes, Smokeless Tobacco, Cigars, and/or Pipes): No  Social History: Patient lives at [facility] with her mother and 2 of her 4 children. She separated from her husband and her older children live with the husband. The patient denies having any legal history. She says she has been working at a [employer] History  Alcohol Use No     History  Drug Use No    Additional Social History:      History of alcohol / drug use?: No history of alcohol / drug abuse     Allergies:  No Known Allergies   Lab Results:  Results for orders placed or performed during the hospital encounter of 05/01/16 (from the past 48 hour(s))  Comprehensive metabolic panel     Status: Abnormal   Collection Time: 05/01/16  5:01 AM  Result Value Ref Range   Sodium 138 135 - 145 mmol/L   Potassium 3.0 (L) 3.5 - 5.1 mmol/L   Chloride 104 101 - 111 mmol/L   CO2 24 22 - 32 mmol/L   Glucose, Bld 111 (H) 65 - 99 mg/dL   BUN 8 6 - 20 mg/dL   Creatinine, Ser 0.91 0.44 - 1.00 mg/dL   Calcium 9.7 8.9 - 10.3 mg/dL   Total Protein 7.7 6.5 - 8.1 g/dL   Albumin 4.1 3.5 - 5.0 g/dL   AST 16 15 - 41 U/L   ALT 11 (L) 14 - 54 U/L   Alkaline Phosphatase 51  38 - 126 U/L   Total Bilirubin 0.5 0.3 - 1.2 mg/dL   GFR calc non Af Amer >60 >60 mL/min   GFR calc Af Amer >60 >60 mL/min    Comment: (NOTE) The eGFR has been calculated using the CKD EPI equation. This calculation has not been validated in all clinical situations. eGFR's persistently <60 mL/min signify possible Chronic Kidney Disease.    Anion gap 10 5 - 15  Ethanol     Status: None   Collection Time: 05/01/16  5:01 AM  Result Value Ref Range   Alcohol, Ethyl (B) <5 <5 mg/dL    Comment:        LOWEST DETECTABLE LIMIT FOR SERUM ALCOHOL IS 5 mg/dL FOR MEDICAL PURPOSES ONLY   Salicylate level     Status: None   Collection Time: 05/01/16  5:01 AM  Result Value Ref Range   Salicylate Lvl <3.4 2.8 - 30.0 mg/dL  Acetaminophen level     Status: Abnormal   Collection Time: 05/01/16  5:01 AM  Result Value Ref Range   Acetaminophen (Tylenol), Serum <10 (L) 10 - 30 ug/mL    Comment:        THERAPEUTIC CONCENTRATIONS VARY SIGNIFICANTLY. A RANGE OF 10-30 ug/mL MAY BE AN EFFECTIVE CONCENTRATION FOR MANY PATIENTS. HOWEVER, SOME ARE BEST TREATED AT CONCENTRATIONS OUTSIDE THIS RANGE. ACETAMINOPHEN CONCENTRATIONS >150 ug/mL AT 4 HOURS AFTER INGESTION AND >50 ug/mL AT 12 HOURS AFTER INGESTION ARE OFTEN ASSOCIATED WITH  TOXIC REACTIONS.   cbc     Status: None   Collection Time: 05/01/16  5:01 AM  Result Value Ref Range   WBC 9.1 3.6 - 11.0 K/uL   RBC 4.43 3.80 - 5.20 MIL/uL   Hemoglobin 14.3 12.0 - 16.0 g/dL   HCT 43.0 35.0 - 47.0 %   MCV 97.1 80.0 - 100.0 fL   MCH 32.4 26.0 - 34.0 pg   MCHC 33.4 32.0 - 36.0 g/dL   RDW 14.1 11.5 - 14.5 %   Platelets 337 150 - 440 K/uL  Urine Drug Screen, Qualitative     Status: Abnormal   Collection Time: 05/01/16  6:37 AM  Result Value Ref Range   Tricyclic, Ur Screen NONE DETECTED NONE DETECTED   Amphetamines, Ur Screen NONE DETECTED NONE DETECTED   MDMA (Ecstasy)Ur Screen NONE DETECTED NONE DETECTED   Cocaine Metabolite,Ur [place] NONE DETECTED NONE DETECTED   Opiate, Ur Screen NONE DETECTED NONE DETECTED   Phencyclidine (PCP) Ur S NONE DETECTED NONE DETECTED   Cannabinoid 50 Ng, Ur [place] POSITIVE (A) NONE DETECTED   Barbiturates, Ur Screen NONE DETECTED NONE DETECTED   Benzodiazepine, Ur Scrn NONE DETECTED NONE DETECTED   Methadone Scn, Ur NONE DETECTED NONE DETECTED    Comment: (NOTE) 196  Tricyclics, urine               Cutoff 1000 ng/mL 200  Amphetamines, urine             Cutoff 1000 ng/mL 300  MDMA (Ecstasy), urine           Cutoff 500 ng/mL 400  Cocaine Metabolite, urine       Cutoff 300 ng/mL 500  Opiate, urine                   Cutoff 300 ng/mL 600  Phencyclidine (PCP), urine      Cutoff 25 ng/mL 700  Cannabinoid, urine              Cutoff 50  ng/mL 800  Barbiturates, urine             Cutoff 200 ng/mL 900  Benzodiazepine, urine           Cutoff 200 ng/mL 1000 Methadone, urine                Cutoff 300 ng/mL 1100 1200 The urine drug screen provides only a preliminary, unconfirmed 1300 analytical test result and should not be used for non-medical 1400 purposes. Clinical consideration and professional judgment should 1500 be applied to any positive drug screen result due to possible 1600 interfering substances. A more specific alternate chemical  method 1700 must be used in order to obtain a confirmed analytical result.  1800 Gas chromato graphy / mass spectrometry (GC/MS) is the preferred 1900 confirmatory method.   Pregnancy, urine     Status: None   Collection Time: 05/01/16  6:37 AM  Result Value Ref Range   Preg Test, Ur NEGATIVE NEGATIVE    Blood Alcohol level:  Lab Results  Component Value Date   ETH <5 10/62/6948    Metabolic Disorder Labs:  No results found for: HGBA1C, MPG No results found for: PROLACTIN No results found for: CHOL, TRIG, HDL, CHOLHDL, VLDL, LDLCALC  Current Medications: Current Facility-Administered Medications  Medication Dose Route Frequency Provider Last Rate Last Dose  . acetaminophen (TYLENOL) tablet 650 mg  650 mg Oral Q6H PRN Lenward Chancellor, MD      . alum & mag hydroxide-simeth (MAALOX/MYLANTA) 200-200-20 MG/5ML suspension 30 mL  30 mL Oral Q4H PRN Lenward Chancellor, MD      . diphenhydrAMINE (BENADRYL) capsule 50 mg  50 mg Oral Q8H PRN Hildred Priest, MD   50 mg at 05/01/16 2112   Or  . diphenhydrAMINE (BENADRYL) injection 50 mg  50 mg Intramuscular Q8H PRN Hildred Priest, MD      . haloperidol (HALDOL) tablet 5 mg  5 mg Oral Q8H PRN Hildred Priest, MD   5 mg at 05/01/16 2113   Or  . haloperidol lactate (HALDOL) injection 5 mg  5 mg Intramuscular Q8H PRN Hildred Priest, MD      . LORazepam (ATIVAN) tablet 2 mg  2 mg Oral Q4H PRN Hildred Priest, MD       Or  . LORazepam (ATIVAN) injection 2 mg  2 mg Intramuscular Q4H PRN Hildred Priest, MD      . magnesium hydroxide (MILK OF MAGNESIA) suspension 30 mL  30 mL Oral Daily PRN Lenward Chancellor, MD       PTA Medications: No prescriptions prior to admission.    Musculoskeletal: Strength & Muscle Tone: within normal limits Gait & Station: normal Patient leans: N/A  Psychiatric Specialty Exam: Physical Exam  Constitutional: She is oriented to person, place, and time.  She appears well-developed and well-nourished.  HENT:  Head: Normocephalic and atraumatic.  Eyes: Conjunctivae and EOM are normal.  Neck: Normal range of motion.  Respiratory: Effort normal.  Musculoskeletal: Normal range of motion.  Neurological: She is alert and oriented to person, place, and time.    Review of Systems  Constitutional: Negative.   HENT: Negative.   Eyes: Negative.   Respiratory: Negative.   Cardiovascular: Negative.   Gastrointestinal: Negative.   Genitourinary: Negative.   Musculoskeletal: Negative.   Skin: Negative.   Neurological: Negative.   Endo/Heme/Allergies: Negative.   Psychiatric/Behavioral: Negative.     Blood pressure 104/77, pulse 74, temperature 98.7 F (37.1 C), temperature source Oral, resp. rate 18, height 5' 2"  (  1.575 m), weight 60.8 kg (134 lb), SpO2 98 %.Body mass index is 24.51 kg/m.  General Appearance: Disheveled  Eye Contact:  Minimal  Speech:  Clear and Coherent  Volume:  Normal  Mood:  Irritable  Affect:  Appropriate and Constricted  Thought Process:  Linear and Descriptions of Associations: Intact  Orientation:  Full (Time, Place, and Person)  Thought Content:  Hallucinations: None  Suicidal Thoughts:  No  Homicidal Thoughts:  No  Memory:  Immediate;   Fair Recent;   Fair Remote;   Fair  Judgement:  Poor  Insight:  Shallow  Psychomotor Activity:  Decreased  Concentration:  Concentration: Fair and Attention Span: Fair  Recall:  [employer] of Knowledge:  Good  Language:  Good  Akathisia:  No  Handed:    AIMS (if indicated):     Assets:  Communication Skills Social Support  ADL's:  Intact  Cognition:  WNL  Sleep:  Number of Hours: 6.3    Treatment Plan Summary:  35 year old African-American female. Who has a prior history of major depressive disorder with psychosis versus bipolar disorder. She was brought in by family for bizarre behavior. In the ER she was interacting to internal stimuli and was disorganized and  hyperreligious.  Provisional diagnoses bipolar disorder: We will need to obtain collateral information from [facility] and from the patient's family. She will be started on haloperidol 5 mg daily at bedtime and Benadryl 50 mg daily at bedtime for now.  Patient took Haldol and Benadryl last night with good response  Agitation: Patient has orders for Haldol, Ativan and Benadryl by mouth or IM when necessary  Hypokalemia: will order k dur 40 meq for 2 days  Tobacco use disorder I will order nicotine patch of 14 mg day  Hospitalization status IVC  Diet regular  Precautions every 15 minute  Vital signs daily  Disposition: Will be discharged back to her mother's home once stable  Follow-up: We will refer back to [facility].  Labs we will order hemoglobin A1c, lipid panel, TSH.  Pregnancy test is negative  We will order an EKG.   Physician Treatment Plan for Primary Diagnosis: Bipolar I disorder ([place]) Long Term Goal(s): Improvement in symptoms so as ready for discharge  Short Term Goals: Ability to identify changes in lifestyle to reduce recurrence of condition will improve, Ability to verbalize feelings will improve, Ability to disclose and discuss suicidal ideas, Compliance with prescribed medications will improve and Ability to identify triggers associated with substance abuse/mental health issues will improve  Physician Treatment Plan for Secondary Diagnosis: Principal Problem:   Bipolar I disorder ([place]) Active Problems:   Tobacco use disorder  Long Term Goal(s): Improvement in symptoms so as ready for discharge  Short Term Goals: Ability to verbalize feelings will improve, Ability to identify and develop effective coping behaviors will improve and Ability to identify triggers associated with substance abuse/mental health issues will improve  I certify that inpatient services furnished can reasonably be expected to improve the patient's condition.    Hildred Priest,  MD 12/25/201711:06 AM

## 2016-05-02 NOTE — [facility] Suicide Risk Assessment (Signed)
[facility]   Nursing information obtained from:    Demographic factors:    Current Mental Status:    Loss Factors:    Historical Factors:    Risk Reduction Factors:     Total Time spent with patient: 1 hour Principal Problem: Bipolar I disorder ([facility]) Diagnosis:   Patient Active Problem List   Diagnosis Date Noted  . Tobacco use disorder [F17.200] 05/02/2016  . Bipolar I disorder ([facility]) [F31.9] 05/02/2016   Subjective Data:   Continued Clinical Symptoms:  Alcohol Use Disorder Identification Test Final Score (AUDIT): 0 The "Alcohol Use Disorders Identification Test", Guidelines for Use in Primary Care, Second Edition.  World Science writerHealth Organization [facility](WHO). Score between 0-7:  no or low risk or alcohol related problems. Score between 8-15:  moderate risk of alcohol related problems. Score between 16-19:  high risk of alcohol related problems. Score 20 or above:  warrants further diagnostic evaluation for alcohol dependence and treatment.   CLINICAL FACTORS:   Severe Anxiety and/or Agitation Currently Psychotic Previous Psychiatric Diagnoses and Treatments    Psychiatric Specialty Exam: Physical Exam  ROS  Blood pressure 104/77, pulse 74, temperature 98.7 F (37.1 C), temperature source Oral, resp. rate 18, height 5\' 2"  (1.575 m), weight 60.8 kg (134 lb), SpO2 98 %.Body mass index is 24.51 kg/m.                                                    Sleep:  Number of Hours: 6.3      COGNITIVE FEATURES THAT CONTRIBUTE TO RISK:  Loss of executive function    SUICIDE RISK:   Moderate:  Frequent suicidal ideation with limited intensity, and duration, some specificity in terms of plans, no associated intent, good self-control, limited dysphoria/symptomatology, some risk factors present, and identifiable protective factors, including available and accessible social support.   [facility]   I certify that inpatient  services furnished can reasonably be expected to improve the patient's condition.  Jimmy FootmanHernandez-Gonzalez,  Jody Osias, MD 05/02/2016, 11:30 AM

## 2016-05-02 NOTE — [facility] Group Notes (Signed)
[facility] Group Notes:  (Nursing/MHT/Case Management/Adjunct)  Date:  05/02/2016  Time:  9:54 PM  Type of Therapy:  Evening Wrap-up Group  Participation Level:  Did Not Attend  Participation Quality:  N/A  Affect:  N/A  Cognitive:  N/A  Insight:  None  Engagement in Group:  Did Not Attend  Modes of Intervention:  Discussion  Summary of Progress/Problems:  Tomasita MorrowChelsea Nanta Lissa Rowles 05/02/2016, 9:54 PM

## 2016-05-03 LAB — LIPID PANEL
Cholesterol: 175 mg/dL (ref 0–200)
HDL: 71 mg/dL (ref >40)
LDL CALC: 89 mg/dL (ref 0–99)
Total CHOL/HDL Ratio: 2.5 RATIO
Triglycerides: 75 mg/dL (ref <150)
VLDL: 15 mg/dL (ref 0–40)

## 2016-05-03 LAB — TSH: TSH: 3.448 u[IU]/mL (ref 0.350–4.500)

## 2016-05-03 NOTE — Progress Notes (Signed)
D. Pt presents with a fixed smile and appears to be in a pleasant mood, but tends to isolate to room. Pt currently denies SI/HI and A/V hallucinations.  A. Labs and vitals monitored. Pt compliant with medication. Pt supported emotionally and encouraged to express concerns and ask questions.   R. Pt remains safe with 15 minute checks. Will continue POC.

## 2016-05-03 NOTE — Progress Notes (Signed)
Patient has been in the milieu, calm and cooperative. Alert and oriented. Denying homicidal/homicidal thoughts. Reports that medications are helping her. No sign of discomfort. Attended group activities and had no concern. Support and encouragements offered. Safety precautions maintained.

## 2016-05-03 NOTE — Progress Notes (Signed)
D: Patient appears flat and depressed. Isolated to room. Would not get up for snack or group. States she's just tired all the time. Denies SI/HI/AVH.  A: Medication given with education. Encouragement provided.  R: Patient was compliant with medication. She remains calm and cooperative. Safety maintained with 15 min checks.

## 2016-05-03 NOTE — Tx Team (Signed)
Interdisciplinary Treatment and Diagnostic Plan Update  05/03/2016 Time of Session: 11:30 AM Jody Eaton MRN: [id number]  Principal Diagnosis: Bipolar I disorder (HCC)  Secondary Diagnoses: Principal Problem:   Bipolar I disorder (HCC) Active Problems:   Tobacco use disorder   Current Medications:  Current Facility-Administered Medications  Medication Dose Route Frequency Provider Last Rate Last Dose  . acetaminophen (TYLENOL) tablet 650 mg  650 mg Oral Q6H PRN Beverly SessionsJagannath Subedi, MD      . alum & mag hydroxide-simeth (MAALOX/MYLANTA) 200-200-20 MG/5ML suspension 30 mL  30 mL Oral Q4H PRN Beverly SessionsJagannath Subedi, MD      . diphenhydrAMINE (BENADRYL) capsule 50 mg  50 mg Oral Q8H PRN Jimmy FootmanAndrea Hernandez-Gonzalez, MD   50 mg at 05/01/16 2112   Or  . diphenhydrAMINE (BENADRYL) injection 50 mg  50 mg Intramuscular Q8H PRN Jimmy FootmanAndrea Hernandez-Gonzalez, MD      . diphenhydrAMINE (BENADRYL) capsule 50 mg  50 mg Oral QHS Jimmy FootmanAndrea Hernandez-Gonzalez, MD   50 mg at 05/02/16 2150  . haloperidol (HALDOL) tablet 5 mg  5 mg Oral Q8H PRN Jimmy FootmanAndrea Hernandez-Gonzalez, MD   5 mg at 05/01/16 2113   Or  . haloperidol lactate (HALDOL) injection 5 mg  5 mg Intramuscular Q8H PRN Jimmy FootmanAndrea Hernandez-Gonzalez, MD      . haloperidol (HALDOL) tablet 5 mg  5 mg Oral QHS Jimmy FootmanAndrea Hernandez-Gonzalez, MD   5 mg at 05/02/16 2151  . LORazepam (ATIVAN) tablet 2 mg  2 mg Oral Q4H PRN Jimmy FootmanAndrea Hernandez-Gonzalez, MD       Or  . LORazepam (ATIVAN) injection 2 mg  2 mg Intramuscular Q4H PRN Jimmy FootmanAndrea Hernandez-Gonzalez, MD      . magnesium hydroxide (MILK OF MAGNESIA) suspension 30 mL  30 mL Oral Daily PRN Beverly SessionsJagannath Subedi, MD      . nicotine (NICODERM CQ - dosed in mg/24 hours) patch 21 mg  21 mg Transdermal Daily Jolanta B Pucilowska, MD   21 mg at 05/03/16 0827   PTA Medications: No prescriptions prior to admission.    Patient Stressors: Financial difficulties Health problems Medication change or noncompliance  Patient Strengths:  Barrister's clerkCommunication skills Motivation for treatment/growth Physical Health  Treatment Modalities: Medication Management, Group therapy, Case management,  1 to 1 session with clinician, Psychoeducation, Recreational therapy.   Physician Treatment Plan for Primary Diagnosis: Bipolar I disorder (HCC) Long Term Goal(s): Improvement in symptoms so as ready for discharge Improvement in symptoms so as ready for discharge   Short Term Goals: Ability to identify changes in lifestyle to reduce recurrence of condition will improve Ability to verbalize feelings will improve Ability to disclose and discuss suicidal ideas Compliance with prescribed medications will improve Ability to identify triggers associated with substance abuse/mental health issues will improve Ability to verbalize feelings will improve Ability to identify and develop effective coping behaviors will improve Ability to identify triggers associated with substance abuse/mental health issues will improve  Medication Management: Evaluate patient's response, side effects, and tolerance of medication regimen.  Therapeutic Interventions: 1 to 1 sessions, Unit Group sessions and Medication administration.  Evaluation of Outcomes: Progressing  Physician Treatment Plan for Secondary Diagnosis: Principal Problem:   Bipolar I disorder (HCC) Active Problems:   Tobacco use disorder  Long Term Goal(s): Improvement in symptoms so as ready for discharge Improvement in symptoms so as ready for discharge   Short Term Goals: Ability to identify changes in lifestyle to reduce recurrence of condition will improve Ability to verbalize feelings will improve Ability to disclose and discuss  suicidal ideas Compliance with prescribed medications will improve Ability to identify triggers associated with substance abuse/mental health issues will improve Ability to verbalize feelings will improve Ability to identify and develop effective coping behaviors  will improve Ability to identify triggers associated with substance abuse/mental health issues will improve     Medication Management: Evaluate patient's response, side effects, and tolerance of medication regimen.  Therapeutic Interventions: 1 to 1 sessions, Unit Group sessions and Medication administration.  Evaluation of Outcomes: Progressing   RN Treatment Plan for Primary Diagnosis: Bipolar I disorder (HCC) Long Term Goal(s): Knowledge of disease and therapeutic regimen to maintain health will improve  Short Term Goals: Ability to remain free from injury will improve, Ability to verbalize frustration and anger appropriately will improve, Ability to verbalize feelings will improve, Ability to identify and develop effective coping behaviors will improve and Compliance with prescribed medications will improve  Medication Management: RN will administer medications as ordered by provider, will assess and evaluate patient's response and provide education to patient for prescribed medication. RN will report any adverse and/or side effects to prescribing provider.  Therapeutic Interventions: 1 on 1 counseling sessions, Psychoeducation, Medication administration, Evaluate responses to treatment, Monitor vital signs and CBGs as ordered, Perform/monitor CIWA, COWS, AIMS and Fall Risk screenings as ordered, Perform wound care treatments as ordered.  Evaluation of Outcomes: Progressing   LCSW Treatment Plan for Primary Diagnosis: Bipolar I disorder (HCC) Long Term Goal(s): Safe transition to appropriate next level of care at discharge, Engage patient in therapeutic group addressing interpersonal concerns.  Short Term Goals: Engage patient in aftercare planning with referrals and resources, Increase social support, Facilitate acceptance of mental health diagnosis and concerns and Identify triggers associated with mental health/substance abuse issues  Therapeutic Interventions: Assess for all  discharge needs, 1 to 1 time with Social worker, Explore available resources and support systems, Assess for adequacy in community support network, Educate family and significant other(s) on suicide prevention, Complete Psychosocial Assessment, Interpersonal group therapy.  Evaluation of Outcomes: Progressing   Progress in Treatment: Attending groups: Yes. Participating in groups: Yes. Taking medication as prescribed: Yes. Toleration medication: Yes. Family/Significant other contact made: No, will contact:  CSW is still assessing proper contacts. Patient understands diagnosis: Yes. Discussing patient identified problems/goals with staff: Yes. Medical problems stabilized or resolved: Yes. Denies suicidal/homicidal ideation: Yes. Issues/concerns per patient self-inventory: No.  New problem(s) identified: No, Describe:  None identified.   Discharge Plan or Barriers:   Reason for Continuation of Hospitalization: Depression Hallucinations  Estimated Length of Stay:  Attendees: Patient: Jody Eaton 05/03/2016 11:43 AM  Physician: Dr. Mordecai RasmussenJohn Clapacs, MD 05/03/2016 11:43 AM  Nursing: Hulan AmatoGwen Farrish, RN  05/03/2016 11:43 AM  RN Care Manager: 05/03/2016 11:43 AM  Social Worker: Hampton AbbotKadijah Dariana Garbett, MSW, LCSW-A 05/03/2016 11:43 AM  Recreational Therapist: Hershal CoriaBeth Greene, LRT, CTRS  05/03/2016 11:43 AM    Scribe for Treatment Team: Lynden OxfordKadijah R Tyreka Henneke, LCSWA 05/03/2016 11:43 AM

## 2016-05-03 NOTE — [facility] Suicide Risk Assessment (Signed)
[facility] INPATIENT:  Family/Significant Other Suicide Prevention Education  Suicide Prevention Education:  Education Completed; mother, Hilary Hertzngela Madrazo ph#: [phone] has been identified by the patient as the family member/significant other with whom the patient will be residing, and identified as the person(s) who will aid the patient in the event of a mental health crisis (suicidal ideations/suicide attempt).  With written consent from the patient, the family member/significant other has been provided the following suicide prevention education, prior to the and/or following the discharge of the patient.  The suicide prevention education provided includes the following:  Suicide risk factors  Suicide prevention and interventions  National Suicide Hotline telephone number  [facility] assessment telephone number  [facility] Emergency Assistance 911  [facility] and/or [facility] telephone number  Request made of family/significant other to:  Remove weapons (e.g., guns, rifles, knives), all items previously/currently identified as safety concern.    Remove drugs/medications (over-the-counter, prescriptions, illicit drugs), all items previously/currently identified as a safety concern.  The family member/significant other verbalizes understanding of the suicide prevention education information provided.  The family member/significant other agrees to remove the items of safety concern listed above.  Lynden OxfordKadijah R Hutson Luft, MSW, LCSW-A 05/03/2016, 2:14 PM
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
## 2016-05-03 NOTE — [facility] Group Notes (Signed)
[facility] Group Notes:  (Nursing/MHT/Case Management/Adjunct)  Date:  05/03/2016  Time:  4:16 PM  Type of Therapy:  Psychoeducational Skills  Participation Level:  Active  Participation Quality:  Appropriate, Attentive and Sharing  Affect:  Appropriate  Cognitive:  Alert and Appropriate  Insight:  Appropriate  Engagement in Group:  Engaged  Modes of Intervention:  Discussion, Education and Support  Summary of Progress/Problems:  Lynelle SmokeCara Travis Sloka Volante 05/03/2016, 4:16 PM

## 2016-05-03 NOTE — [facility] Group Notes (Signed)
[facility] LCSW Group Therapy Note  Date/Time: 05/03/16 1500  Type of Therapy and Topic:  Group Therapy:  Overcoming Obstacles  Participation Level:  active  Description of Group:    In this group patients will be encouraged to explore what they see as obstacles to their own wellness and recovery. They will be guided to discuss their thoughts, feelings, and behaviors related to these obstacles. The group will process together ways to cope with barriers, with attention given to specific choices patients can make. Each patient will be challenged to identify changes they are motivated to make in order to overcome their obstacles. This group will be process-oriented, with patients participating in exploration of their own experiences as well as giving and receiving support and challenge from other group members.  Therapeutic Goals: 1. Patient will identify personal and current obstacles as they relate to admission. 2. Patient will identify barriers that currently interfere with their wellness or overcoming obstacles.  3. Patient will identify feelings, thought process and behaviors related to these barriers. 4. Patient will identify two changes they are willing to make to overcome these obstacles:    Summary of Patient Progress: pt was appropriately active during the group and identified lack of eating and lack of sleep as two obstacles she needs to overcome to stay healthy.  Pt also reported that asking for help is the best way she can overcome obstacles in the future.   Therapeutic Modalities:   Cognitive Behavioral Therapy Solution Focused Therapy Motivational Interviewing Relapse Prevention Therapy  Daleen SquibbGreg Kirsi Hugh, LCSW

## 2016-05-03 NOTE — Progress Notes (Signed)
Recreation Therapy Notes  Date: 12.26.17 Time: 9:30 am Location: Craft Room  Group Topic: Self-expression  Goal Area(s) Addresses:  Patient will be able to identify a color that represents each emotion.  Patient will verbalize benefit of using art as a means of self-expression. Patient will verbalize one emotion experienced during group.  Behavioral Response: Attentive, Interactive  Intervention: The Colors Within Me  Activity: Patients were given blank face worksheets and were instructed to pick a color for each emotion they were feeling. Patients were instructed to show on the worksheet how much of that emotion they were feeling.  Education: LRT educated patients on other forms of self-expression.   Education Outcome: Acknowledges education/In group clarification offered  Clinical Observations/Feedback: Patient picked a color for each emotion and showed on the worksheet how much of that emotion she was feeling. Patient contributed to group discussion by stating what emotions she was feeling, how her emotions affect her treatment in the hospital, and that her emotions are dynamic.  Jacquelynn CreeGreene,Kesha Hurrell M, LRT/CTRS 05/03/2016 1:30 PM

## 2016-05-03 NOTE — Plan of Care (Signed)
Problem: Health Behavior/Discharge Planning: Goal: Compliance with prescribed medication regimen will improve Outcome: Progressing Patient was compliant with  Medications during this shift

## 2016-05-03 NOTE — Progress Notes (Signed)
[facility] MD Progress Note  05/03/2016 1:05 PM Jody Eaton  MRN:  [id number] Subjective:  "I don't know why my mother brought me here". Chart reviewed. Patient examined. This is a 35 year old woman with a history of psychotic episodes brought here with reports that she had been emotionally labile psychotic and confused at home. H&P indicates that she was still displaying psychotic behavior and thinking yesterday. Patient was started on haloperidol. Today she minimizes or denies all symptoms acting like she doesn't have any idea why she is here in the hospital. Patient is cooperative. Neatly groomed. No physical complaints. No acutely dangerous behavior here. Principal Problem: Bipolar I disorder (HCC) Diagnosis:   Patient Active Problem List   Diagnosis Date Noted  . Tobacco use disorder [F17.200] 05/02/2016  . Bipolar I disorder (HCC) [F31.9] 05/02/2016   Total Time spent with patient: 30 minutes  Past Psychiatric History: Patient has a history of recurrent episodes of psychosis and has been variously diagnosed with bipolar disorder and recurrent depression with psychosis. Admission physician this time is diagnosed her with bipolar and started low dose of antipsychotic.  Past Medical History:  Past Medical History:  Diagnosis Date  . Bipolar 1 disorder (HCC)   . Schizophrenia (HCC)    History reviewed. No pertinent surgical history. Family History: History reviewed. No pertinent family history. Family Psychiatric  History: Family history of depression Social History:  History  Alcohol Use No     History  Drug Use No    Social History   Social History  . Marital status: Single    Spouse name: N/A  . Number of children: N/A  . Years of education: N/A   Social History Main Topics  . Smoking status: Light Tobacco Smoker    Packs/day: 0.00    Years: 1.00  . Smokeless tobacco: Never Used  . Alcohol use No  . Drug use: No  . Sexual activity: Yes    Birth control/ protection:  Condom   Other Topics Concern  . None   Social History Narrative  . None   Additional Social History:    History of alcohol / drug use?: No history of alcohol / drug abuse                    Sleep: Negative  Appetite:  Fair  Current Medications: Current Facility-Administered Medications  Medication Dose Route Frequency Provider Last Rate Last Dose  . acetaminophen (TYLENOL) tablet 650 mg  650 mg Oral Q6H PRN Beverly SessionsJagannath Subedi, MD      . alum & mag hydroxide-simeth (MAALOX/MYLANTA) 200-200-20 MG/5ML suspension 30 mL  30 mL Oral Q4H PRN Beverly SessionsJagannath Subedi, MD      . diphenhydrAMINE (BENADRYL) capsule 50 mg  50 mg Oral Q8H PRN Jimmy FootmanAndrea Hernandez-Gonzalez, MD   50 mg at 05/01/16 2112   Or  . diphenhydrAMINE (BENADRYL) injection 50 mg  50 mg Intramuscular Q8H PRN Jimmy FootmanAndrea Hernandez-Gonzalez, MD      . diphenhydrAMINE (BENADRYL) capsule 50 mg  50 mg Oral QHS Jimmy FootmanAndrea Hernandez-Gonzalez, MD   50 mg at 05/02/16 2150  . haloperidol (HALDOL) tablet 5 mg  5 mg Oral Q8H PRN Jimmy FootmanAndrea Hernandez-Gonzalez, MD   5 mg at 05/01/16 2113   Or  . haloperidol lactate (HALDOL) injection 5 mg  5 mg Intramuscular Q8H PRN Jimmy FootmanAndrea Hernandez-Gonzalez, MD      . haloperidol (HALDOL) tablet 5 mg  5 mg Oral QHS Jimmy FootmanAndrea Hernandez-Gonzalez, MD   5 mg at 05/02/16 2151  .  LORazepam (ATIVAN) tablet 2 mg  2 mg Oral Q4H PRN Jimmy Footman, MD       Or  . LORazepam (ATIVAN) injection 2 mg  2 mg Intramuscular Q4H PRN Jimmy Footman, MD      . magnesium hydroxide (MILK OF MAGNESIA) suspension 30 mL  30 mL Oral Daily PRN Beverly Sessions, MD      . nicotine (NICODERM CQ - dosed in mg/24 hours) patch 21 mg  21 mg Transdermal Daily Shari Prows, MD   21 mg at 05/03/16 3244    Lab Results:  Results for orders placed or performed during the hospital encounter of 05/01/16 (from the past 48 hour(s))  Lipid panel     Status: None   Collection Time: 05/03/16  7:28 AM  Result Value Ref Range    Cholesterol 175 0 - 200 mg/dL   Triglycerides 75 <010 mg/dL   HDL 71 >27 mg/dL   Total CHOL/HDL Ratio 2.5 RATIO   VLDL 15 0 - 40 mg/dL   LDL Cholesterol 89 0 - 99 mg/dL    Comment:        Total Cholesterol/HDL:CHD Risk Coronary Heart Disease Risk Table                     Men   Women  1/2 Average Risk   3.4   3.3  Average Risk       5.0   4.4  2 X Average Risk   9.6   7.1  3 X Average Risk  23.4   11.0        Use the calculated Patient Ratio above and the CHD Risk Table to determine the patient's CHD Risk.        ATP III CLASSIFICATION (LDL):  <100     mg/dL   Optimal  253-664  mg/dL   Near or Above                    Optimal  130-159  mg/dL   Borderline  403-474  mg/dL   High  >259     mg/dL   Very High   TSH     Status: None   Collection Time: 05/03/16  7:28 AM  Result Value Ref Range   TSH 3.448 0.350 - 4.500 uIU/mL    Comment: Performed by a 3rd Generation assay with a functional sensitivity of <=0.01 uIU/mL.    Blood Alcohol level:  Lab Results  Component Value Date   ETH <5 05/01/2016    Metabolic Disorder Labs: No results found for: HGBA1C, MPG No results found for: PROLACTIN Lab Results  Component Value Date   CHOL 175 05/03/2016   TRIG 75 05/03/2016   HDL 71 05/03/2016   CHOLHDL 2.5 05/03/2016   VLDL 15 05/03/2016   LDLCALC 89 05/03/2016    Physical Findings: AIMS: Facial and Oral Movements Muscles of Facial Expression: None, normal Lips and Perioral Area: None, normal Jaw: None, normal Tongue: None, normal,Extremity Movements Upper (arms, wrists, hands, fingers): None, normal Lower (legs, knees, ankles, toes): None, normal, Trunk Movements Neck, shoulders, hips: None, normal, Overall Severity Severity of abnormal movements (highest score from questions above): None, normal Incapacitation due to abnormal movements: None, normal Patient's awareness of abnormal movements (rate only patient's report): No Awareness, Dental Status Current problems  with teeth and/or dentures?: No Does patient usually wear dentures?: No  CIWA:    COWS:  COWS Total Score: 0  Musculoskeletal: Strength & Muscle  Tone: within normal limits Gait & Station: normal Patient leans: N/A  Psychiatric Specialty Exam: Physical Exam  Nursing note and vitals reviewed. Constitutional: She appears well-developed and well-nourished.  HENT:  Head: Normocephalic and atraumatic.  Eyes: Conjunctivae are normal. Pupils are equal, round, and reactive to light.  Neck: Normal range of motion.  Cardiovascular: Regular rhythm and normal heart sounds.   Respiratory: Effort normal. No respiratory distress.  GI: Soft.  Musculoskeletal: Normal range of motion.  Neurological: She is alert.  Skin: Skin is warm and dry.  Psychiatric: Her affect is labile and inappropriate. Her speech is tangential. She is not hyperactive and not combative. She expresses impulsivity. She expresses no homicidal and no suicidal ideation. She exhibits abnormal recent memory.    Review of Systems  Constitutional: Negative.   HENT: Negative.   Eyes: Negative.   Respiratory: Negative.   Cardiovascular: Negative.   Gastrointestinal: Negative.   Musculoskeletal: Negative.   Skin: Negative.   Neurological: Negative.   Psychiatric/Behavioral: Negative.     Blood pressure 104/73, pulse 82, temperature 97.9 F (36.6 C), temperature source Oral, resp. rate 19, height 5\' 2"  (1.575 m), weight 60.8 kg (134 lb), SpO2 100 %.Body mass index is 24.51 kg/m.  General Appearance: Fairly Groomed  Eye Contact:  Good  Speech:  Normal Rate  Volume:  Decreased  Mood:  Euthymic  Affect:  Inappropriate  Thought Process:  Linear  Orientation:  Full (Time, Place, and Person)  Thought Content:  Illogical  Suicidal Thoughts:  No  Homicidal Thoughts:  No  Memory:  Immediate;   Fair Recent;   Fair Remote;   Fair  Judgement:  Impaired  Insight:  Shallow  Psychomotor Activity:  Decreased  Concentration:   Concentration: Fair  Recall:  [employer]  Fund of Knowledge:  Fair  Language:  Fair  Akathisia:  No  Handed:  Right  AIMS (if indicated):     Assets:  Housing Physical Health Social Support  ADL's:  Intact  Cognition:  Impaired,  Mild  Sleep:  Number of Hours: 8.25     Treatment Plan Summary: Daily contact with patient to assess and evaluate symptoms and progress in treatment, Medication management and Plan Patient was admitted to the psychiatry ward. Continue 15 minute checks. Continue engagement in groups and activities with evaluation by the full treatment team. Appears to be tolerating the Haldol at this point with some improvement in her lability. I we will not change any of her doses at this point. Patient encouraged to engage with staff and patients. Continue medicine. Ongoing reevaluation daily with arrangement for appropriate discharge planning.  Jody RasmussenJohn Clapacs, MD 05/03/2016, 1:05 PM

## 2016-05-04 LAB — HEMOGLOBIN A1C
Hgb A1c MFr Bld: 5.1 % (ref 4.8–5.6)
MEAN PLASMA GLUCOSE: 100 mg/dL

## 2016-05-04 NOTE — [facility] Group Notes (Signed)
[facility] LCSW Group Therapy  1:00PM Type of Therapy: Group Therapy   Participation Level: Active  Participation Quality: Attentive, Supportive  Affect: Appropriate   Cognitive: Alert and Oriented   Insight: Developing/Improving and Engaged   Engagement in Therapy: Developing/Improving and Engaged  Modes of Intervention: Clarification, Confrontation, Discussion, Education, Exploration, Limit-setting, Orientation, Problem-solving, Rapport Building, Dance movement psychotherapisteality Testing, Socialization and Support   Summary of Progress/Problems: The topic for group today was emotional regulation. This group focused on both positive and negative emotion identification and allowed  group members to process ways to identify feelings, regulate negative emotions, and find healthy ways to manage internal/external emotions. Group members were asked to reflect on a time when their reaction to an emotion led to a negative outcome and explored how alternative responses using emotion regulation would have benefited them. Group members were also asked to discuss a time when emotion regulation was utilized when a negative emotion was experienced. The patient was able to identify a negative emotion and/or behavior that she would like to work on regulating. The patient spoke with a positive outlook and stated that it was her husband that noticed she needed to come to the hospital. The patient identified listening to music as a coping skill that is helpful for her.  Jonathon JordanLynn B Nollie Shiflett, MSW, LCSWA 05/04/16, 3:17 PM

## 2016-05-04 NOTE — Progress Notes (Signed)
Patient stayed in bed sleeping. Had no concern throughout this shift. Safety precautions maintained.

## 2016-05-04 NOTE — Progress Notes (Signed)
[facility] MD Progress Note  05/04/2016 3:36 PM Jody Eaton  MRN:  [id number] Subjective:  "I don't know why my mother brought me here". Chart reviewed. Patient examined. This is a 35 year old woman with a history of psychotic episodes brought here with reports that she had been emotionally labile psychotic and confused at home. H&P indicates that she was still displaying psychotic behavior and thinking yesterday. Patient was started on haloperidol. Today she minimizes or denies all symptoms acting like she doesn't have any idea why she is here in the hospital. Patient is cooperative. Neatly groomed. No physical complaints. No acutely dangerous behavior here.  Follow-up for the 27th. Patient seen. Chart reviewed. Patient is neatly dressed and is going to group and behaving well. Not showing any dangerous behavior. She took the prescribed Haldol last night. On interview today she is vague and a little tangential. Still seems blank and out of it in her thinking. Smiling in an odd way. Most likely psychotic but calming down a little bit. Principal Problem: Bipolar I disorder (HCC) Diagnosis:   Patient Active Problem List   Diagnosis Date Noted  . Tobacco use disorder [F17.200] 05/02/2016  . Bipolar I disorder (HCC) [F31.9] 05/02/2016   Total Time spent with patient: 30 minutes  Past Psychiatric History: Patient has a history of recurrent episodes of psychosis and has been variously diagnosed with bipolar disorder and recurrent depression with psychosis. Admission physician this time is diagnosed her with bipolar and started low dose of antipsychotic.  Past Medical History:  Past Medical History:  Diagnosis Date  . Bipolar 1 disorder (HCC)   . Schizophrenia (HCC)    History reviewed. No pertinent surgical history. Family History: History reviewed. No pertinent family history. Family Psychiatric  History: Family history of depression Social History:  History  Alcohol Use No     History  Drug  Use No    Social History   Social History  . Marital status: Single    Spouse name: N/A  . Number of children: N/A  . Years of education: N/A   Social History Main Topics  . Smoking status: Light Tobacco Smoker    Packs/day: 0.00    Years: 1.00  . Smokeless tobacco: Never Used  . Alcohol use No  . Drug use: No  . Sexual activity: Yes    Birth control/ protection: Condom   Other Topics Concern  . None   Social History Narrative  . None   Additional Social History:    History of alcohol / drug use?: No history of alcohol / drug abuse                    Sleep: Negative  Appetite:  Fair  Current Medications: Current Facility-Administered Medications  Medication Dose Route Frequency Provider Last Rate Last Dose  . acetaminophen (TYLENOL) tablet 650 mg  650 mg Oral Q6H PRN Beverly Sessions, MD      . alum & mag hydroxide-simeth (MAALOX/MYLANTA) 200-200-20 MG/5ML suspension 30 mL  30 mL Oral Q4H PRN Beverly Sessions, MD      . diphenhydrAMINE (BENADRYL) capsule 50 mg  50 mg Oral Q8H PRN Jimmy Footman, MD   50 mg at 05/01/16 2112   Or  . diphenhydrAMINE (BENADRYL) injection 50 mg  50 mg Intramuscular Q8H PRN Jimmy Footman, MD      . diphenhydrAMINE (BENADRYL) capsule 50 mg  50 mg Oral QHS Jimmy Footman, MD   50 mg at 05/03/16 2115  . haloperidol (HALDOL)  tablet 5 mg  5 mg Oral Q8H PRN Jimmy FootmanAndrea Hernandez-Gonzalez, MD   5 mg at 05/01/16 2113   Or  . haloperidol lactate (HALDOL) injection 5 mg  5 mg Intramuscular Q8H PRN Jimmy FootmanAndrea Hernandez-Gonzalez, MD      . haloperidol (HALDOL) tablet 5 mg  5 mg Oral QHS Jimmy FootmanAndrea Hernandez-Gonzalez, MD   5 mg at 05/03/16 2116  . LORazepam (ATIVAN) tablet 2 mg  2 mg Oral Q4H PRN Jimmy FootmanAndrea Hernandez-Gonzalez, MD       Or  . LORazepam (ATIVAN) injection 2 mg  2 mg Intramuscular Q4H PRN Jimmy FootmanAndrea Hernandez-Gonzalez, MD      . magnesium hydroxide (MILK OF MAGNESIA) suspension 30 mL  30 mL Oral Daily PRN Beverly SessionsJagannath  Subedi, MD      . nicotine (NICODERM CQ - dosed in mg/24 hours) patch 21 mg  21 mg Transdermal Daily Jolanta B Pucilowska, MD   21 mg at 05/04/16 0800    Lab Results:  Results for orders placed or performed during the hospital encounter of 05/01/16 (from the past 48 hour(s))  Lipid panel     Status: None   Collection Time: 05/03/16  7:28 AM  Result Value Ref Range   Cholesterol 175 0 - 200 mg/dL   Triglycerides 75 <063<150 mg/dL   HDL 71 >01>40 mg/dL   Total CHOL/HDL Ratio 2.5 RATIO   VLDL 15 0 - 40 mg/dL   LDL Cholesterol 89 0 - 99 mg/dL    Comment:        Total Cholesterol/HDL:CHD Risk Coronary Heart Disease Risk Table                     Men   Women  1/2 Average Risk   3.4   3.3  Average Risk       5.0   4.4  2 X Average Risk   9.6   7.1  3 X Average Risk  23.4   11.0        Use the calculated Patient Ratio above and the CHD Risk Table to determine the patient's CHD Risk.        ATP III CLASSIFICATION (LDL):  <100     mg/dL   Optimal  601-093100-129  mg/dL   Near or Above                    Optimal  130-159  mg/dL   Borderline  235-573160-189  mg/dL   High  >220>190     mg/dL   Very High   Hemoglobin A1c     Status: None   Collection Time: 05/03/16  7:28 AM  Result Value Ref Range   Hgb A1c MFr Bld 5.1 4.8 - 5.6 %    Comment: (NOTE)         Pre-diabetes: 5.7 - 6.4         Diabetes: >6.4         Glycemic control for adults with diabetes: <7.0    Mean Plasma Glucose 100 mg/dL    Comment: (NOTE) Performed At: [facility] [address] [place], [place] [id number] Mila HomerHancock William F MD JS:[phone]   TSH     Status: None   Collection Time: 05/03/16  7:28 AM  Result Value Ref Range   TSH 3.448 0.350 - 4.500 uIU/mL    Comment: Performed by a 3rd Generation assay with a functional sensitivity of <=0.01 uIU/mL.    Blood Alcohol level:  Lab Results  Component Value  Date   ETH <5 05/01/2016    Metabolic Disorder Labs: Lab Results  Component Value Date   HGBA1C 5.1  05/03/2016   MPG 100 05/03/2016   No results found for: PROLACTIN Lab Results  Component Value Date   CHOL 175 05/03/2016   TRIG 75 05/03/2016   HDL 71 05/03/2016   CHOLHDL 2.5 05/03/2016   VLDL 15 05/03/2016   LDLCALC 89 05/03/2016    Physical Findings: AIMS: Facial and Oral Movements Muscles of Facial Expression: None, normal Lips and Perioral Area: None, normal Jaw: None, normal Tongue: None, normal,Extremity Movements Upper (arms, wrists, hands, fingers): None, normal Lower (legs, knees, ankles, toes): None, normal, Trunk Movements Neck, shoulders, hips: None, normal, Overall Severity Severity of abnormal movements (highest score from questions above): None, normal Incapacitation due to abnormal movements: None, normal Patient's awareness of abnormal movements (rate only patient's report): No Awareness, Dental Status Current problems with teeth and/or dentures?: No Does patient usually wear dentures?: No  CIWA:    COWS:  COWS Total Score: 0  Musculoskeletal: Strength & Muscle Tone: within normal limits Gait & Station: normal Patient leans: N/A  Psychiatric Specialty Exam: Physical Exam  Nursing note and vitals reviewed. Constitutional: She appears well-developed and well-nourished.  HENT:  Head: Normocephalic and atraumatic.  Eyes: Conjunctivae are normal. Pupils are equal, round, and reactive to light.  Neck: Normal range of motion.  Cardiovascular: Regular rhythm and normal heart sounds.   Respiratory: Effort normal. No respiratory distress.  GI: Soft.  Musculoskeletal: Normal range of motion.  Neurological: She is alert.  Skin: Skin is warm and dry.  Psychiatric: Her affect is blunt. Her affect is not labile and not inappropriate. Her speech is tangential. She is not hyperactive and not combative. She expresses impulsivity. She expresses no homicidal and no suicidal ideation. She exhibits abnormal recent memory.    Review of Systems  Constitutional:  Negative.   HENT: Negative.   Eyes: Negative.   Respiratory: Negative.   Cardiovascular: Negative.   Gastrointestinal: Negative.   Musculoskeletal: Negative.   Skin: Negative.   Neurological: Negative.   Psychiatric/Behavioral: Negative.     Blood pressure 107/75, pulse 75, temperature 98.4 F (36.9 C), temperature source Oral, resp. rate 18, height 5\' 2"  (1.575 m), weight 60.8 kg (134 lb), SpO2 100 %.Body mass index is 24.51 kg/m.  General Appearance: Fairly Groomed  Eye Contact:  Good  Speech:  Normal Rate  Volume:  Decreased  Mood:  Euthymic  Affect:  Inappropriate  Thought Process:  Linear  Orientation:  Full (Time, Place, and Person)  Thought Content:  Illogical  Suicidal Thoughts:  No  Homicidal Thoughts:  No  Memory:  Immediate;   Fair Recent;   Fair Remote;   Fair  Judgement:  Impaired  Insight:  Shallow  Psychomotor Activity:  Decreased  Concentration:  Concentration: Fair  Recall:  [employer]Fair  Fund of Knowledge:  Fair  Language:  Fair  Akathisia:  No  Handed:  Right  AIMS (if indicated):     Assets:  Housing Physical Health Social Support  ADL's:  Intact  Cognition:  Impaired,  Mild  Sleep:  Number of Hours: 6.15     Treatment Plan Summary: Daily contact with patient to assess and evaluate symptoms and progress in treatment, Medication management and Plan Continue Haldol at night. Supportive counseling to the patient. Seems to be getting better. Might benefit from a decanoate shot given her poor insight. Continue group activity in daily reassessment.  Dnaiel Voller,  MD 05/04/2016, 3:36 PM

## 2016-05-04 NOTE — Plan of Care (Signed)
Problem: Safety: Goal: Ability to remain free from injury will improve Outcome: Progressing Denies SI/HI. Safety precautions maintained on the unit

## 2016-05-04 NOTE — Progress Notes (Signed)
Patient with appropriate affect, cooperative behavior with meals, meds and plan of care. No SI/HI at this time. Appropriate with staff and peers. Patient states her stay here "has helped her and that she is hoping to discharge soon". Therapy groups encouraged and safety maintained.

## 2016-05-04 NOTE — Plan of Care (Signed)
Problem: Education: Goal: Will be free of psychotic symptoms Outcome: Progressing Denies hallucinations. Alert and oriented X4

## 2016-05-04 NOTE — Progress Notes (Signed)
Recreation Therapy Notes  Date: 12.27.17 Time: 9:30 am Location: Craft Room  Group Topic: Self-esteem  Goal Area(s) Addresses:  Patient will write at least one positive trait about self. Patient will verbalize benefit of having healthy self-esteem.  Behavioral Response: Attentive, Interactive  Intervention: I Am  Activity: Patients were given a worksheet with the letter I on it and were instructed to write positive traits about themselves inside the letter.  Education: LRT educated patients on ways they can increase their self-esteem.  Education Outcome: Acknowledges education/In group clarification offered  Clinical Observations/Feedback: Patient wrote positive traits about self. Patient contributed to group discussion by stating how her self-esteem affects her.  Jacquelynn CreeGreene,Shataya Winkles M, LRT/CTRS 05/04/2016 11:55 AM

## 2016-05-05 MED ORDER — HALOPERIDOL DECANOATE 100 MG/ML IM SOLN
50.0000 mg | INTRAMUSCULAR | Status: AC
  Administered 2016-05-05: 50 mg via INTRAMUSCULAR
  Filled 2016-05-05: qty 0.5

## 2016-05-05 MED ORDER — HALOPERIDOL 5 MG PO TABS: 5.0000 mg | ORAL_TABLET | Freq: Every day | ORAL | 1 refills | Status: DC

## 2016-05-05 MED ORDER — HALOPERIDOL DECANOATE 100 MG/ML IM SOLN: 50.0000 mg | INTRAMUSCULAR | 1 refills | Status: DC

## 2016-05-05 MED ORDER — HALOPERIDOL DECANOATE 100 MG/ML IM SOLN: 50.0000 mg | INTRAMUSCULAR | Status: DC

## 2016-05-05 MED ORDER — DIPHENHYDRAMINE HCL 50 MG PO CAPS: 50.0000 mg | ORAL_CAPSULE | Freq: Every day | ORAL | 0 refills | Status: DC

## 2016-05-05 MED ORDER — HALOPERIDOL 5 MG PO TABS: 5.0000 mg | ORAL_TABLET | Freq: Every day | ORAL | 0 refills | Status: DC

## 2016-05-05 MED ORDER — DIPHENHYDRAMINE HCL 50 MG PO CAPS: 50.0000 mg | ORAL_CAPSULE | Freq: Every day | ORAL | 1 refills | Status: DC

## 2016-05-05 NOTE — Progress Notes (Signed)
D: Pt denies SI/HI/AVH. Pt is pleasant and cooperative, affect is bright. Pt ppears less anxious and she is interacting with peers and staff appropriately.  A: Pt was offered support and encouragement. Pt was given scheduled medications. Pt was encouraged to attend groups. Q 15 minute checks were done for safety.  R:Pt attends groups and interacts well with peers and staff. Pt is taking medication. Pt has no complaints.Pt receptive to treatment and safety maintained on unit.

## 2016-05-05 NOTE — Discharge Summary (Signed)
Physician Discharge Summary Note  Patient:  Jody Eaton is an 35 y.o., female MRN:  [id number] DOB:  1980-12-30 Patient phone:  [phone] (home)  Patient address:   [phone] [place] [address],  Total Time spent with patient: 45 minutes  Date of Admission:  05/01/2016 Date of Discharge: 05/05/2016  Reason for Admission:  Admitted under IVC because of agitated psychotic behavior medicine noncompliance hyperactivity disorganized thinking.  Principal Problem: Bipolar I disorder [facility]) Discharge Diagnoses: Patient Active Problem List   Diagnosis Date Noted  . Tobacco use disorder [F17.200] 05/02/2016  . Bipolar I disorder (HCC) [F31.9] 05/02/2016    Past Psychiatric History: patient has a history of psychosis.. Also a history of medicine noncompliance.diagnosis of bipolar disorder. No past history of suicide attempts. Positive past history of hospitalization. Follows up at [facility] but has been noncompliant  Past Medical History:  Past Medical History:  Diagnosis Date  . Bipolar 1 disorder (HCC)   . Schizophrenia (HCC)    History reviewed. No pertinent surgical history. Family History: History reviewed. No pertinent family history. Family Psychiatric  History: negative Social History:  History  Alcohol Use No     History  Drug Use No    Social History   Social History  . Marital status: Single    Spouse name: N/A  . Number of children: N/A  . Years of education: N/A   Social History Main Topics  . Smoking status: Light Tobacco Smoker    Packs/day: 0.00    Years: 1.00  . Smokeless tobacco: Never Used  . Alcohol use No  . Drug use: No  . Sexual activity: Yes    Birth control/ protection: Condom   Other Topics Concern  . None   Social History Narrative  . None    Hospital Course:  Patient was started on haloperidol oral 5 mg at night. She has been compliant with medication. Initially she was described as still hyperactive hyperverbal  disorganized and intrusive. Her symptoms have improved significantly. Patient is sleeping better. She is calm. She is not agitated. Good care for activities of daily living. Not displaying dangerous behavior. Insight is still questionable. VI have recommended to her that decanoate medicationwould be a wise choice. She will be given 50 mg of haloperidol decanoate today and a prescription for monthly shots. I am also going to continue her oral medicine. Continue Benadryl at night to help with sleep as well. Discharged today with follow-up at [facility].  Physical Findings: AIMS: Facial and Oral Movements Muscles of Facial Expression: None, normal Lips and Perioral Area: None, normal Jaw: None, normal Tongue: None, normal,Extremity Movements Upper (arms, wrists, hands, fingers): None, normal Lower (legs, knees, ankles, toes): None, normal, Trunk Movements Neck, shoulders, hips: None, normal, Overall Severity Severity of abnormal movements (highest score from questions above): None, normal Incapacitation due to abnormal movements: None, normal Patient's awareness of abnormal movements (rate only patient's report): No Awareness, Dental Status Current problems with teeth and/or dentures?: No Does patient usually wear dentures?: No  CIWA:    COWS:  COWS Total Score: 0  Musculoskeletal: Strength & Muscle Tone: within normal limits Gait & Station: normal Patient leans: N/A  Psychiatric Specialty Exam: Physical Exam  Nursing note and vitals reviewed. Constitutional: She appears well-developed and well-nourished.  HENT:  Head: Normocephalic and atraumatic.  Eyes: Conjunctivae are normal. Pupils are equal, round, and reactive to light.  Neck: Normal range of motion.  Cardiovascular: Regular rhythm and normal heart sounds.  Respiratory: Effort normal. No respiratory distress.  GI: Soft.  Musculoskeletal: Normal range of motion.  Neurological: She is alert.  Skin: Skin is warm and dry.   Psychiatric: Her affect is blunt. Her speech is delayed. She is slowed. Thought content is not paranoid. Cognition and memory are normal. She expresses impulsivity. She expresses no homicidal and no suicidal ideation.    Review of Systems  Constitutional: Negative.   HENT: Negative.   Eyes: Negative.   Respiratory: Negative.   Cardiovascular: Negative.   Gastrointestinal: Negative.   Musculoskeletal: Negative.   Skin: Negative.   Neurological: Negative.   Psychiatric/Behavioral: Negative.     Blood pressure 122/83, pulse 72, temperature 98.3 F (36.8 C), resp. rate 18, height 5\' 2"  (1.575 m), weight 60.8 kg (134 lb), SpO2 100 %.Body mass index is 24.51 kg/m.  General Appearance: Fairly Groomed  Eye Contact:  Good  Speech:  Clear and Coherent  Volume:  Decreased  Mood:  Euthymic  Affect:  Congruent and Constricted  Thought Process:  Goal Directed  Orientation:  Full (Time, Place, and Person)  Thought Content:  Logical  Suicidal Thoughts:  No  Homicidal Thoughts:  No  Memory:  Immediate;   Good Recent;   Fair Remote;   Fair  Judgement:  Fair  Insight:  Shallow  Psychomotor Activity:  Normal  Concentration:  Concentration: Fair  Recall:  Fair  Fund of Knowledge:  Fair  Language:  Fair  Akathisia:  No  Handed:  Right  AIMS (if indicated):     Assets:  Communication Skills Desire for Improvement Financial Resources/Insurance Housing Resilience Social Support  ADL's:  Intact  Cognition:  WNL  Sleep:  Number of Hours: 6.15     Have you used any form of tobacco in the last 30 days? (Cigarettes, Smokeless Tobacco, Cigars, and/or Pipes): No  Has this patient used any form of tobacco in the last 30 days? (Cigarettes, Smokeless Tobacco, Cigars, and/or Pipes) Yes, No  Blood Alcohol level:  Lab Results  Component Value Date   ETH <5 05/01/2016    Metabolic Disorder Labs:  Lab Results  Component Value Date   HGBA1C 5.1 05/03/2016   MPG 100 05/03/2016   No  results found for: PROLACTIN Lab Results  Component Value Date   CHOL 175 05/03/2016   TRIG 75 05/03/2016   HDL 71 05/03/2016   CHOLHDL 2.5 05/03/2016   VLDL 15 05/03/2016   LDLCALC 89 05/03/2016    See Psychiatric Specialty Exam and Suicide Risk Assessment completed by Attending Physician prior to discharge.  Discharge destination:  Home  Is patient on multiple antipsychotic therapies at discharge:  No   Has Patient had three or more failed trials of antipsychotic monotherapy by history:  No  Recommended Plan for Multiple Antipsychotic Therapies: NA  Discharge Instructions    Diet - low sodium heart healthy    Complete by:  As directed    Increase activity slowly    Complete by:  As directed      Allergies as of 05/05/2016   No Known Allergies     Medication List    TAKE these medications     Indication  diphenhydrAMINE 50 MG capsule Commonly known as:  BENADRYL Take 1 capsule (50 mg total) by mouth at bedtime.  Indication:  Trouble Sleeping   haloperidol 5 MG tablet Commonly known as:  HALDOL Take 1 tablet (5 mg total) by mouth at bedtime.  Indication:  Psychosis   haloperidol decanoate 100 MG/ML  injection Commonly known as:  HALDOL DECANOATE Inject 0.5 mLs (50 mg total) into the muscle every 30 (thirty) days. Start taking on:  06/02/2016  Indication:  Psychosis      Follow-up Information    [facility]. Go on 05/10/2016.   Why:  Please follow-up with [facility] on Jan. 2nd at [facility] for medication management and therapy. It is important that you bring your discharge paperwork to this appointment. Contact information: Address: [address] [place], [place] [address] Phone: [phone]) [phone] Fax: [phone]          Follow-up recommendations:  Activity:  Activity as tolerated Diet:  Regular diet Other:  [facility]. Avoid alcohol and drugs. Continue oral medicines for now. Next shot due January 25. Make sure you are getting  a good night sleep.  Comments:  See notes above. Discharge completed for today. Patient attended treatment team.  Signed: Mordecai RasmussenJohn Roniqua Kintz, MD 05/05/2016, 12:04 PM

## 2016-05-05 NOTE — [facility] Suicide Risk Assessment (Signed)
[facility]   Principal Problem: Bipolar I disorder [facility](HCC) Discharge Diagnoses:  Patient Active Problem List   Diagnosis Date Noted  . Tobacco use disorder [F17.200] 05/02/2016  . Bipolar I disorder (HCC) [F31.9] 05/02/2016    Total Time spent with patient: 45 minutes  Musculoskeletal: Strength & Muscle Tone: within normal limits Gait & Station: normal Patient leans: N/A  Psychiatric Specialty Exam: Review of Systems  Constitutional: Negative.   HENT: Negative.   Eyes: Negative.   Respiratory: Negative.   Cardiovascular: Negative.   Gastrointestinal: Negative.   Musculoskeletal: Negative.   Skin: Negative.   Neurological: Negative.   Psychiatric/Behavioral: Negative.     Blood pressure 122/83, pulse 72, temperature 98.3 F (36.8 C), resp. rate 18, height 5\' 2"  (1.575 m), weight 60.8 kg (134 lb), SpO2 100 %.Body mass index is 24.51 kg/m.  General Appearance: Fairly Groomed  Patent attorneyye Contact::  Good  Speech:  Normal Rate409  Volume:  Decreased  Mood:  Euthymic  Affect:  Congruent and Constricted  Thought Process:  Goal Directed  Orientation:  Full (Time, Place, and Person)  Thought Content:  Logical  Suicidal Thoughts:  No  Homicidal Thoughts:  No  Memory:  Immediate;   Good Recent;   Fair Remote;   Poor  Judgement:  Fair  Insight:  Lacking  Psychomotor Activity:  Normal  Concentration:  Fair  Recall:  FiservFair  Fund of Knowledge:Fair  Language: Fair  Akathisia:  No  Handed:  Right  AIMS (if indicated):     Assets:  Communication Skills Desire for Improvement Housing Physical Health Resilience Social Support  Sleep:  Number of Hours: 6.15  Cognition: WNL  ADL's:  Intact   Mental Status Per Nursing Assessment::   On Admission:     Demographic Factors:  NA  Loss Factors: NA  Historical Factors: NA  Risk Reduction Factors:   Responsible for children under 36 years of age, Sense of responsibility to family, Religious beliefs  about death and Positive social support  Continued Clinical Symptoms:  Bipolar Disorder:   Mixed State  Cognitive Features That Contribute To Risk:  Loss of executive function    Suicide Risk:  Minimal: No identifiable suicidal ideation.  Patients presenting with no risk factors but with morbid ruminations; may be classified as minimal risk based on the severity of the depressive symptoms  Follow-up Information    [facility]. Go on 05/10/2016.   Why:  Please follow-up with [facility] on Jan. 2nd at [facility]AM for medication management and therapy. It is important that you bring your discharge paperwork to this appointment. Contact information: Address: [address] [place], [place] [address] Phone: [phone]) [phone] Fax: [phone]) [phone]          Plan Of Care/Follow-up recommendations:  Activity:  Activity as tolerated Diet:  Regular diet Other:  Continue current medication. Strongly recommend continued follow-up with haloperidol decanoate. Avoid all drugs and alcohol. [facility] with [facility].  Jody RasmussenJohn Iyesha Such, MD 05/05/2016, 11:58 AM

## 2016-05-05 NOTE — Progress Notes (Signed)
  [facility] :  Will you be returning to the same living situation after discharge:  Yes,  home with mother. At discharge, do you have transportation home?: Yes,  mother will pick pt up. Do you have the ability to pay for your medications: No.  Release of information consent forms completed and in the chart;  Patient's signature needed at discharge.  Patient to Follow up at: Follow-up Information    [facility]. Go on 05/10/2016.   Why:  Please follow-up with [facility] on Jan. 2nd at [facility] for medication management and therapy. It is important that you bring your discharge paperwork to this appointment. Contact information: Address: [address] [place], [place] [address] Phone: [phone](336) [phone] Fax: [phone]) [phone]          Next level of care provider [facility] Link:no  Safety Planning and Suicide Prevention discussed: Yes,  SPE completed with patient and mom.  Have you used any form of tobacco in the last 30 days? (Cigarettes, Smokeless Tobacco, Cigars, and/or Pipes): No  Has patient been referred to the Quitline?: Patient refused referral  Patient has been referred for addiction treatment: Pt. refused referral  Lynden OxfordKadijah R Berta Denson, MSW, LCSW-A 05/05/2016, 2:11 PM

## 2016-05-05 NOTE — Tx Team (Signed)
Interdisciplinary Treatment and Diagnostic Plan Update  05/05/2016 Time of Session: 11:00 AM Jody Eaton MRN: [id number]  Principal Diagnosis: Bipolar I disorder (HCC)  Secondary Diagnoses: Principal Problem:   Bipolar I disorder (HCC) Active Problems:   Tobacco use disorder   Current Medications:  Current Facility-Administered Medications  Medication Dose Route Frequency Provider Last Rate Last Dose  . acetaminophen (TYLENOL) tablet 650 mg  650 mg Oral Q6H PRN Beverly SessionsJagannath Subedi, MD      . alum & mag hydroxide-simeth (MAALOX/MYLANTA) 200-200-20 MG/5ML suspension 30 mL  30 mL Oral Q4H PRN Beverly SessionsJagannath Subedi, MD      . diphenhydrAMINE (BENADRYL) capsule 50 mg  50 mg Oral Q8H PRN Jimmy FootmanAndrea Hernandez-Gonzalez, MD   50 mg at 05/01/16 2112   Or  . diphenhydrAMINE (BENADRYL) injection 50 mg  50 mg Intramuscular Q8H PRN Jimmy FootmanAndrea Hernandez-Gonzalez, MD      . diphenhydrAMINE (BENADRYL) capsule 50 mg  50 mg Oral QHS Jimmy FootmanAndrea Hernandez-Gonzalez, MD   50 mg at 05/04/16 2148  . haloperidol (HALDOL) tablet 5 mg  5 mg Oral Q8H PRN Jimmy FootmanAndrea Hernandez-Gonzalez, MD   5 mg at 05/01/16 2113   Or  . haloperidol lactate (HALDOL) injection 5 mg  5 mg Intramuscular Q8H PRN Jimmy FootmanAndrea Hernandez-Gonzalez, MD      . haloperidol (HALDOL) tablet 5 mg  5 mg Oral QHS Jimmy FootmanAndrea Hernandez-Gonzalez, MD   5 mg at 05/04/16 2148  . LORazepam (ATIVAN) tablet 2 mg  2 mg Oral Q4H PRN Jimmy FootmanAndrea Hernandez-Gonzalez, MD       Or  . LORazepam (ATIVAN) injection 2 mg  2 mg Intramuscular Q4H PRN Jimmy FootmanAndrea Hernandez-Gonzalez, MD      . magnesium hydroxide (MILK OF MAGNESIA) suspension 30 mL  30 mL Oral Daily PRN Beverly SessionsJagannath Subedi, MD      . nicotine (NICODERM CQ - dosed in mg/24 hours) patch 21 mg  21 mg Transdermal Daily Jolanta B Pucilowska, MD   21 mg at 05/05/16 0800   PTA Medications: No prescriptions prior to admission.    Patient Stressors: Financial difficulties Health problems Medication change or noncompliance  Patient Strengths:  Barrister's clerkCommunication skills Motivation for treatment/growth Physical Health  Treatment Modalities: Medication Management, Group therapy, Case management,  1 to 1 session with clinician, Psychoeducation, Recreational therapy.   Physician Treatment Plan for Primary Diagnosis: Bipolar I disorder (HCC) Long Term Goal(s): Improvement in symptoms so as ready for discharge Improvement in symptoms so as ready for discharge   Short Term Goals: Ability to identify changes in lifestyle to reduce recurrence of condition will improve Ability to verbalize feelings will improve Ability to disclose and discuss suicidal ideas Compliance with prescribed medications will improve Ability to identify triggers associated with substance abuse/mental health issues will improve Ability to verbalize feelings will improve Ability to identify and develop effective coping behaviors will improve Ability to identify triggers associated with substance abuse/mental health issues will improve  Medication Management: Evaluate patient's response, side effects, and tolerance of medication regimen.  Therapeutic Interventions: 1 to 1 sessions, Unit Group sessions and Medication administration.  Evaluation of Outcomes: Progressing  Physician Treatment Plan for Secondary Diagnosis: Principal Problem:   Bipolar I disorder (HCC) Active Problems:   Tobacco use disorder  Long Term Goal(s): Improvement in symptoms so as ready for discharge Improvement in symptoms so as ready for discharge   Short Term Goals: Ability to identify changes in lifestyle to reduce recurrence of condition will improve Ability to verbalize feelings will improve Ability to disclose and discuss  suicidal ideas Compliance with prescribed medications will improve Ability to identify triggers associated with substance abuse/mental health issues will improve Ability to verbalize feelings will improve Ability to identify and develop effective coping behaviors  will improve Ability to identify triggers associated with substance abuse/mental health issues will improve     Medication Management: Evaluate patient's response, side effects, and tolerance of medication regimen.  Therapeutic Interventions: 1 to 1 sessions, Unit Group sessions and Medication administration.  Evaluation of Outcomes: Progressing   RN Treatment Plan for Primary Diagnosis: Bipolar I disorder (HCC) Long Term Goal(s): Knowledge of disease and therapeutic regimen to maintain health will improve  Short Term Goals: Ability to remain free from injury will improve, Ability to verbalize frustration and anger appropriately will improve, Ability to verbalize feelings will improve, Ability to identify and develop effective coping behaviors will improve and Compliance with prescribed medications will improve  Medication Management: RN will administer medications as ordered by provider, will assess and evaluate patient's response and provide education to patient for prescribed medication. RN will report any adverse and/or side effects to prescribing provider.  Therapeutic Interventions: 1 on 1 counseling sessions, Psychoeducation, Medication administration, Evaluate responses to treatment, Monitor vital signs and CBGs as ordered, Perform/monitor CIWA, COWS, AIMS and Fall Risk screenings as ordered, Perform wound care treatments as ordered.  Evaluation of Outcomes: Progressing   LCSW Treatment Plan for Primary Diagnosis: Bipolar I disorder (HCC) Long Term Goal(s): Safe transition to appropriate next level of care at discharge, Engage patient in therapeutic group addressing interpersonal concerns.  Short Term Goals: Engage patient in aftercare planning with referrals and resources, Increase social support, Facilitate acceptance of mental health diagnosis and concerns and Identify triggers associated with mental health/substance abuse issues  Therapeutic Interventions: Assess for all  discharge needs, 1 to 1 time with Social worker, Explore available resources and support systems, Assess for adequacy in community support network, Educate family and significant other(s) on suicide prevention, Complete Psychosocial Assessment, Interpersonal group therapy.  Evaluation of Outcomes: Progressing   Progress in Treatment: Attending groups: Yes. Participating in groups: Yes. Taking medication as prescribed: Yes. Toleration medication: Yes. Family/Significant other contact made: No, will contact:  CSW is still assessing proper contacts. Patient understands diagnosis: Yes. Discussing patient identified problems/goals with staff: Yes. Medical problems stabilized or resolved: Yes. Denies suicidal/homicidal ideation: Yes. Issues/concerns per patient self-inventory: No.  New problem(s) identified: No, Describe:  None identified.   Discharge Plan or Barriers:   Reason for Continuation of Hospitalization: Depression Hallucinations  Estimated Length of Stay:  Attendees: Patient: Jody Eaton 05/05/2016 10:57 AM  Physician: Dr. Mordecai RasmussenJohn Clapacs, MD 05/05/2016 10:57 AM  Nursing: Elenore PaddyJennifer Morrow, RN  05/05/2016 10:57 AM  RN Care Manager: 05/05/2016 10:57 AM  Social Worker: Hampton AbbotKadijah Domanic Matusek, MSW, LCSW-A 05/05/2016 10:57 AM  Recreational Therapist: Hershal CoriaBeth Greene, LRT, CTRS  05/05/2016 10:57 AM    Scribe for Treatment Team: Lynden OxfordKadijah R Asberry Lascola, LCSWA 05/05/2016 10:57 AM

## 2016-05-05 NOTE — Progress Notes (Signed)
Patient with appropriate affect, cooperative behavior with meals, meds and plan of care. No SI/HI/AVH at this time. Patient social and supportive with select female peers. Appropriate when staff initiates interaction. Reports she is focused on discharge today. Safety maintained.

## 2016-05-05 NOTE — [facility] Group Notes (Signed)
[facility] Group Notes:  (Nursing/MHT/Case Management/Adjunct)  Date:  05/05/2016  Time:  1:45 AM  Type of Therapy:  Group Therapy  Participation Level:  Did Not Attend    Jody Eaton 05/05/2016, 1:45 AM

## 2016-05-05 NOTE — Progress Notes (Signed)
Haldol Deconate IM administered as ordered. Acknowledges all belongings have been returned. Patient to discharge with Mom for transport home when 7 day supply is complete. Safety maintained. Acknowledges understanding of discharge plan of care.

## 2017-07-02 ENCOUNTER — Emergency Department
Admission: EM | Admit: 2017-07-02 | Discharge: 2017-07-03 | Disposition: A | Discharge status: Home / Self Care | Payer: No Typology Code available for payment source | Attending: Emergency Medicine | Admitting: Emergency Medicine

## 2017-07-02 ENCOUNTER — Emergency Department: Payer: Self-pay

## 2017-07-02 ENCOUNTER — Encounter: Payer: Self-pay | Admitting: Emergency Medicine

## 2017-07-02 ENCOUNTER — Other Ambulatory Visit: Payer: Self-pay

## 2017-07-02 DIAGNOSIS — E876 Hypokalemia: Secondary | ICD-10-CM

## 2017-07-02 DIAGNOSIS — Z91199 Patient's noncompliance with other medical treatment and regimen due to unspecified reason: Secondary | ICD-10-CM

## 2017-07-02 DIAGNOSIS — Z9119 Patient's noncompliance with other medical treatment and regimen: Secondary | ICD-10-CM

## 2017-07-02 DIAGNOSIS — F319 Bipolar disorder, unspecified: Secondary | ICD-10-CM | POA: Diagnosis present

## 2017-07-02 DIAGNOSIS — Z79899 Other long term (current) drug therapy: Secondary | ICD-10-CM | POA: Insufficient documentation

## 2017-07-02 DIAGNOSIS — F1210 Cannabis abuse, uncomplicated: Secondary | ICD-10-CM | POA: Insufficient documentation

## 2017-07-02 DIAGNOSIS — F17210 Nicotine dependence, cigarettes, uncomplicated: Secondary | ICD-10-CM | POA: Insufficient documentation

## 2017-07-02 DIAGNOSIS — R443 Hallucinations, unspecified: Secondary | ICD-10-CM | POA: Insufficient documentation

## 2017-07-02 DIAGNOSIS — F3112 Bipolar disorder, current episode manic without psychotic features, moderate: Secondary | ICD-10-CM | POA: Insufficient documentation

## 2017-07-02 LAB — CBC
HEMATOCRIT: 43.1 % (ref 35.0–47.0)
Hemoglobin: 14.5 g/dL (ref 12.0–16.0)
MCH: 32.2 pg (ref 26.0–34.0)
MCHC: 33.5 g/dL (ref 32.0–36.0)
MCV: 96 fL (ref 80.0–100.0)
Platelets: 338 10*3/uL (ref 150–440)
RBC: 4.49 MIL/uL (ref 3.80–5.20)
RDW: 13.9 % (ref 11.5–14.5)
WBC: 13.3 10*3/uL — ABNORMAL HIGH (ref 3.6–11.0)

## 2017-07-02 LAB — COMPREHENSIVE METABOLIC PANEL
ALT: 23 U/L (ref 14–54)
AST: 47 U/L — AB (ref 15–41)
Albumin: 4.4 g/dL (ref 3.5–5.0)
Alkaline Phosphatase: 48 U/L (ref 38–126)
Anion gap: 9 (ref 5–15)
BILIRUBIN TOTAL: 0.6 mg/dL (ref 0.3–1.2)
BUN: 6 mg/dL (ref 6–20)
CHLORIDE: 104 mmol/L (ref 101–111)
CO2: 25 mmol/L (ref 22–32)
Calcium: 9.4 mg/dL (ref 8.9–10.3)
Creatinine, Ser: 1.02 mg/dL — ABNORMAL HIGH (ref 0.44–1.00)
GFR calc Af Amer: >60 mL/min (ref >60)
GLUCOSE: 129 mg/dL — AB (ref 65–99)
Potassium: 3 mmol/L — ABNORMAL LOW (ref 3.5–5.1)
Sodium: 138 mmol/L (ref 135–145)
Total Protein: 8.4 g/dL — ABNORMAL HIGH (ref 6.5–8.1)

## 2017-07-02 LAB — URINE DRUG SCREEN, QUALITATIVE (ARMC ONLY)
Amphetamines, Ur Screen: NOT DETECTED
BARBITURATES, UR SCREEN: NOT DETECTED
BENZODIAZEPINE, UR SCRN: NOT DETECTED
CANNABINOID 50 NG, UR ~~LOC~~: POSITIVE — AB
COCAINE METABOLITE, UR ~~LOC~~: NOT DETECTED
MDMA (Ecstasy)Ur Screen: NOT DETECTED
METHADONE SCREEN, URINE: NOT DETECTED
OPIATE, UR SCREEN: NOT DETECTED
Phencyclidine (PCP) Ur S: NOT DETECTED
Tricyclic, Ur Screen: NOT DETECTED

## 2017-07-02 LAB — URINALYSIS, COMPLETE (UACMP) WITH MICROSCOPIC
Bilirubin Urine: NEGATIVE
GLUCOSE, UA: NEGATIVE mg/dL
Ketones, ur: NEGATIVE mg/dL
Leukocytes, UA: NEGATIVE
Nitrite: NEGATIVE
PH: 6 (ref 5.0–8.0)
Protein, ur: NEGATIVE mg/dL
RBC / HPF: NONE SEEN RBC/hpf (ref 0–5)
SPECIFIC GRAVITY, URINE: 1.002 — AB (ref 1.005–1.030)
SQUAMOUS EPITHELIAL / LPF: NONE SEEN

## 2017-07-02 LAB — ETHANOL

## 2017-07-02 LAB — POCT PREGNANCY, URINE: PREG TEST UR: NEGATIVE

## 2017-07-02 LAB — SALICYLATE LEVEL

## 2017-07-02 LAB — ACETAMINOPHEN LEVEL: Acetaminophen (Tylenol), Serum: <10 ug/mL — ABNORMAL LOW (ref 10–30)

## 2017-07-02 MED ORDER — ZIPRASIDONE MESYLATE 20 MG IM SOLR
INTRAMUSCULAR | Status: AC
  Administered 2017-07-02: 10 mg via INTRAMUSCULAR
  Filled 2017-07-02: qty 20

## 2017-07-02 MED ORDER — ZIPRASIDONE MESYLATE 20 MG IM SOLR
10.0000 mg | Freq: Once | INTRAMUSCULAR | Status: AC
  Administered 2017-07-02: 10 mg via INTRAMUSCULAR
  Filled 2017-07-02: qty 20

## 2017-07-02 MED ORDER — OLANZAPINE 10 MG PO TABS: 10.0000 mg | ORAL_TABLET | Freq: Every day | ORAL | Status: DC

## 2017-07-02 MED ORDER — DIPHENHYDRAMINE HCL 50 MG/ML IJ SOLN
25.0000 mg | Freq: Once | INTRAMUSCULAR | Status: AC
  Administered 2017-07-02: 25 mg via INTRAVENOUS
  Filled 2017-07-02: qty 1

## 2017-07-02 MED ORDER — ZIPRASIDONE MESYLATE 20 MG IM SOLR
10.0000 mg | Freq: Once | INTRAMUSCULAR | Status: AC
  Administered 2017-07-02: 10 mg via INTRAMUSCULAR

## 2017-07-02 MED ORDER — OLANZAPINE 5 MG PO TABS: 5.0000 mg | ORAL_TABLET | Freq: Every day | ORAL | Status: DC

## 2017-07-02 MED ORDER — POTASSIUM CHLORIDE CRYS ER 20 MEQ PO TBCR
40.0000 meq | EXTENDED_RELEASE_TABLET | Freq: Once | ORAL | Status: AC
  Administered 2017-07-02: 40 meq via ORAL
  Filled 2017-07-02: qty 2

## 2017-07-02 MED ORDER — LORAZEPAM 2 MG/ML IJ SOLN
2.0000 mg | Freq: Once | INTRAMUSCULAR | Status: AC
  Administered 2017-07-02: 2 mg via INTRAVENOUS
  Filled 2017-07-02: qty 1

## 2017-07-02 MED ORDER — OLANZAPINE 5 MG PO TABS
5.0000 mg | ORAL_TABLET | Freq: Every day | ORAL | Status: DC
  Administered 2017-07-03: 5 mg via ORAL
  Filled 2017-07-02: qty 1

## 2017-07-02 MED ORDER — OLANZAPINE 10 MG PO TABS
10.0000 mg | ORAL_TABLET | Freq: Every day | ORAL | Status: DC
  Administered 2017-07-02: 10 mg via ORAL
  Filled 2017-07-02: qty 1

## 2017-07-02 NOTE — ED Notes (Signed)
Pt stoic, refuses to answer any questions except, answered name with "Jody Eaton.... That motherfucker gave me AIDS!!!" pt then pulled up her shirt to reveal her chest to this RN (pt has significant birth mark of left side abdomen - approx 10 inch diameter), unflinching eye contact  Per sister pt has dx of bi-polar with hyper mania, pt stopped taking her once per month shots and now within the last 2-3 days has been "getting more hyper - I can tell when she skips them because I've been in here before committing her [the pt]", pt also hasn't slept or eaten for days

## 2017-07-02 NOTE — ED Notes (Signed)
Patient is hypersexual and overly flirtatious with staff of opposite sex. [facility] patient was flirting with him during the consultation. He stated staff is at risk for accusations of a sexual nature.

## 2017-07-02 NOTE — ED Provider Notes (Signed)
[facility] Emergency Department Provider Note   ____________________________________________   None    (approximate)  I have reviewed the triage vital signs and the nursing notes.   HISTORY  Chief Complaint Agitation  Level 5 caveat: Limited by agitation  HPI Leaner R Mcbryar is a 37 y.o. female brought to the ED from home by her sister with a chief complaint of agitation.  Patient has a history of bipolar disorder, schizophrenia, who has missed her monthly injections at [facility].  Presents with hallucinations and erratic behavior.  Rest of history is limited secondary to patient's behavior.  Sister reports patient was standing on the bathtub yesterday, slipped and fell, striking her head.  Denies LOC.  Did not seek medical attention.   Past Medical History:  Diagnosis Date  . Bipolar 1 disorder (HCC)   . Schizophrenia [facility])     Patient Active Problem List   Diagnosis Date Noted  . Tobacco use disorder 05/02/2016  . Bipolar I disorder (HCC) 05/02/2016    History reviewed. No pertinent surgical history.  Prior to Admission medications   Medication Sig Start Date End Date Taking? Authorizing Provider  diphenhydrAMINE (BENADRYL) 50 MG capsule Take 1 capsule (50 mg total) by mouth at bedtime. 05/05/16   Clapacs, Jackquline Denmark, MD  haloperidol (HALDOL) 5 MG tablet Take 1 tablet (5 mg total) by mouth at bedtime. 05/05/16   Clapacs, Jackquline Denmark, MD  haloperidol decanoate (HALDOL DECANOATE) 100 MG/ML injection Inject 0.5 mLs (50 mg total) into the muscle every 30 (thirty) days. 06/02/16   Clapacs, Jackquline Denmark, MD    Allergies Patient has no known allergies.  No family history on file.  Social History Social History   Tobacco Use  . Smoking status: Light Tobacco Smoker    Packs/day: 0.00    Years: 1.00    Pack years: 0.00  . Smokeless tobacco: Never Used  Substance Use Topics  . Alcohol use: No  . Drug use: No    Review of Systems  Constitutional: No  fever/chills. Eyes: No visual changes. ENT: No sore throat. Cardiovascular: Denies chest pain. Respiratory: Denies shortness of breath. Gastrointestinal: No abdominal pain.  No nausea, no vomiting.  No diarrhea.  No constipation. Genitourinary: Negative for dysuria. Musculoskeletal: Negative for back pain. Skin: Negative for rash. Neurological: Negative for headaches, focal weakness or numbness. Psychiatric:Positive for erratic behavior.   ____________________________________________   PHYSICAL EXAM:  VITAL SIGNS: ED Triage Vitals  Enc Vitals Group     BP 07/02/17 0226 (!) 145/99     Pulse Rate 07/02/17 0226 (!) 124     Resp 07/02/17 0226 20     Temp 07/02/17 0226 99 F (37.2 C)     Temp Source 07/02/17 0226 Oral     SpO2 07/02/17 0226 98 %     Weight 07/02/17 0217 130 lb (59 kg)     Height 07/02/17 0217 5\' 2"  (1.575 m)     Head Circumference --      Peak Flow --      Pain Score 07/02/17 0217 0     Pain Loc --      Pain Edu? --      Excl. in GC? --     Constitutional: Alert and oriented.  Dishelved appearing and in mild acute distress. Eyes: Conjunctivae are normal. PERRL. EOMI. Head: Small posterior scalp hematoma. Nose: No congestion/rhinnorhea. Mouth/Throat: Mucous membranes are moist.  Oropharynx non-erythematous. Neck: No stridor.  No cervical spine tenderness to palpation.  No step-offs or deformities noted. Cardiovascular: Normal rate, regular rhythm. Grossly normal heart sounds.  Good peripheral circulation. Respiratory: Normal respiratory effort.  No retractions. Lungs CTAB. Gastrointestinal: Soft and nontender. No distention. No abdominal bruits. No CVA tenderness. Musculoskeletal: No lower extremity tenderness nor edema.  No joint effusions. Neurologic:  Normal speech and language. No gross focal neurologic deficits are appreciated. No gait instability. Skin:  Skin is warm, dry and intact. No rash noted. Psychiatric: Mood and affect are agitated. Speech  and behavior are normal.  ____________________________________________   LABS (all labs ordered are listed, but only abnormal results are displayed)  Labs Reviewed  COMPREHENSIVE METABOLIC PANEL - Abnormal; Notable for the following components:      Result Value   Potassium 3.0 (*)    Glucose, Bld 129 (*)    Creatinine, Ser 1.02 (*)    Total Protein 8.4 (*)    AST 47 (*)    All other components within normal limits  CBC - Abnormal; Notable for the following components:   WBC 13.3 (*)    All other components within normal limits  URINE DRUG SCREEN, QUALITATIVE ([facility] ONLY) - Abnormal; Notable for the following components:   Cannabinoid 50 Ng, Ur [place] POSITIVE (*)    All other components within normal limits  URINALYSIS, COMPLETE (UACMP) WITH MICROSCOPIC - Abnormal; Notable for the following components:   Color, Urine STRAW (*)    APPearance CLEAR (*)    Specific Gravity, Urine 1.002 (*)    Hgb urine dipstick LARGE (*)    Bacteria, UA RARE (*)    All other components within normal limits  ACETAMINOPHEN LEVEL - Abnormal; Notable for the following components:   Acetaminophen (Tylenol), Serum <10 (*)    All other components within normal limits  ETHANOL  SALICYLATE LEVEL  POC URINE PREG, ED  POCT PREGNANCY, URINE   ____________________________________________  EKG  None ____________________________________________  RADIOLOGY  ED MD interpretation: No ICH  Official radiology report(s): Ct Head Wo Contrast  Result Date: 07/02/2017 CLINICAL DATA:  Altered mental status. Head injury yesterday. History of schizophrenia and bipolar disorder. EXAM: CT HEAD WITHOUT CONTRAST TECHNIQUE: Contiguous axial images were obtained from the base of the skull through the vertex without intravenous contrast. COMPARISON:  CT HEAD April 09, 2014 FINDINGS: BRAIN: No intraparenchymal hemorrhage, mass effect nor midline shift. The ventricles and sulci are normal. No acute large vascular territory  infarcts. No abnormal extra-axial fluid collections. Basal cisterns are patent. VASCULAR: Unremarkable. SKULL/SOFT TISSUES: No skull fracture. No significant soft tissue swelling. ORBITS/SINUSES: The included ocular globes and orbital contents are normal.The mastoid aircells and included paranasal sinuses are well-aerated. OTHER: None. IMPRESSION: Normal noncontrast CT HEAD. Electronically Signed   By: Awilda Metro M.D.   On: 07/02/2017 03:59    ____________________________________________   PROCEDURES  Procedure(s) performed: None  Procedures  Critical Care performed: No  ____________________________________________   INITIAL IMPRESSION / ASSESSMENT AND PLAN / ED COURSE  As part of my medical decision making, I reviewed the following data within the electronic MEDICAL RECORD NUMBER History obtained from family, Nursing notes reviewed and incorporated, Labs reviewed, Old chart reviewed, Radiograph reviewed, A consult was requested and obtained from this/these consultant(s) Psychiatry and Notes from prior ED visits.   36 year old female with bipolar disorder and schizophrenia who presents to the ED with erratic behavior.  We will placed under involuntary commitment for patient's and staff safety.  She is agitated and unable to be verbally redirected; requires IM calming agents.  Will obtain screening toxicological lab work and urinalysis; will obtain CT head given patient's recent head injury.  Clinical Course as of Jul 02 606  Wynelle LinkSun Jul 02, 2017  0405 Patient calm after IM calming agent.  She was able to undergo CT scan without difficulty.  Currently sleeping in no acute distress.  Laboratory and imaging results noted.  At this time patient is medically cleared [facility]  She will remain in the emergency department under IVC pending psychiatric disposition.  [JS]  0607 Patient remains asleep in NAD. [facility].  [JS]      Clinical Course User Index [JS] Irean HongSung, Jade J, MD     ____________________________________________   FINAL CLINICAL IMPRESSION(S) / ED DIAGNOSES  Final diagnoses:  Bipolar affective disorder, currently manic, moderate (HCC)  Hypokalemia  Marijuana abuse     ED Discharge Orders    None       Note:  This document was prepared using Dragon voice recognition software and may include unintentional dictation errors.    Irean HongSung, Jade J, MD 07/02/17 [phone]

## 2017-07-02 NOTE — ED Notes (Signed)
Pt's husband, Elizabeth SauerStacey Outlaw's number is [phone] and cell [phone].

## 2017-07-02 NOTE — ED Notes (Signed)
SOC computer in room.  

## 2017-07-02 NOTE — ED Notes (Signed)
Patient is IVC'd

## 2017-07-02 NOTE — ED Notes (Signed)
ED provider stated to give Zyprexa when patient awakens but do not wake her to give medication.

## 2017-07-02 NOTE — ED Notes (Signed)
Pt's sister Amado Coealitha Kunsman, [phone] is available for questions.

## 2017-07-02 NOTE — ED Provider Notes (Signed)
Patient's EKG reviewed at 1615 Ventricular rate 100 Cures 86 QTC 485 Normal sinus rhythm, slight prolongation of the QT interval at 485.  Have reduce the patient scheduled Zyprexa dosing due to a slight prolongation of the QT interval.  We will continue to monitor this, I have ordered a repeat EKG for 8 AM tomorrow for QTc monitoring.  Ongoing care assigned to Dr. Zenda AlpersWebster.   Sharyn CreamerQuale, Mark, MD 07/03/17 [phone]

## 2017-07-02 NOTE — ED Triage Notes (Signed)
Pt arrives POV with sister with c/o agitation. Pt reports hallucinations visually and audible. Pt walked past this RN at time of triage and went into the subwait area and was preaching to the pt and family in that area. Pt denies SI or HI at this time. At this time in triage, pt is answering questions appropriately. Per sister, pt has been getting her manic depression medication shot since possibly before her birthday in [birth year].

## 2017-07-02 NOTE — ED Notes (Signed)
Patient became agitated, overly animated, and demanding. Meal and fluids offered patient, patient refused both.

## 2017-07-02 NOTE — ED Provider Notes (Signed)
I discussed by phone with the psychiatrist who is recommending continue involuntary commitment and recommending hospital admission for mania.  He recommends twice daily Zyprexa 10 mg, start today at noon and second dose at 9 PM.  I will place these orders.    Patient did later become agitated, undirectable and required IM geodon, ativan and benadryl.   Governor RooksLord, Dessie Tatem, MD 07/03/17 [phone]

## 2017-07-02 NOTE — ED Notes (Addendum)
Jody Eaton, [phone], SISTER, going home att

## 2017-07-02 NOTE — BH Assessment (Signed)
[facility] Assessment Progress Note   TTS is unable to assess patient at this time because she is too sedated.

## 2017-07-02 NOTE — ED Notes (Signed)
Pateint took sheets off of her bed

## 2017-07-02 NOTE — ED Notes (Signed)
SOC completed. [facility] is Dr. Loretha BrasilHulkower [phone]

## 2017-07-02 NOTE — ED Notes (Signed)
Patient needed encouragement from security to take injections. Patient did not have to be placed in hold.

## 2017-07-02 NOTE — BH Assessment (Signed)
Assessment Note  Jody Eaton is an 37 y.o. female. Ms. Jody Eaton arrived to the ED arrived to the ED by way of transportation by her sister.  She reports that she came in because she did not have any sleep. She states that everytime she wants to sleep her husband wants to talk. She denied symptoms of depression or anxiety.  She denied having auditory or visual hallucinations.  She denied having suicidal or homicidal ideation or intent.  She denied anger or irritation.  "My husband is stressing me all the way out".  She states "it is something all the time". She denied the use of alcohol or drugs.  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  IVC paperwork reports "Bipolar d/o; erratic behavior, aggression. Patient provided conflicting information.  She denied psychiatric symptoms or concerns to the TTS.  Upon arrival, she reported auditory and visual hallucinations and was demonstrating manic behaviors.  She was reported as "preaching" to other patients.    TTS spoke with her sister Jody Eaton [phone]). She reports that Jody Eaton has not slept in a few days and she has not eaten in a few day.  She reports that she has had an increase of stress.  She has started school recently as well as working and taking care of 2 young kids.  She states that she was being seen at [facility] to get a monthly shot, and she states that she stopped taking it because her doctor stated that she did not have to take it. She reports that her husband adds to her stress level a lot.    Diagnosis: Bipolar Disorder  Past Medical History:  Past Medical History:  Diagnosis Date  . Bipolar 1 disorder (HCC)   . Schizophrenia (HCC)     History reviewed. No pertinent surgical history.  Family History: No family history on file.  Social History:  reports that she has been smoking.  She has been smoking about 0.00 packs per day for the past 1.00 year. she has never used smokeless tobacco. She  reports that she does not drink alcohol or use drugs.  Additional Social History:  Alcohol / Drug Use History of alcohol / drug use?: No history of alcohol / drug abuse(Denied by patient)  CIWA: CIWA-Ar BP: 111/87 Pulse Rate: (!) 110 COWS:    Allergies: No Known Allergies  Home Medications:  (Not in a hospital admission)  OB/GYN Status:  Patient's last menstrual period was 07/02/2017 (exact date).  General Assessment Data Assessment unable to be completed: Yes Reason for not completing assessment: (Patient has been sedated with Geodon) Location of Assessment: [facility] ED TTS Assessment: In system Is this a Tele or Face-to-Face Assessment?: Face-to-Face Is this an Initial Assessment or a Re-assessment for this encounter?: Initial Assessment Marital status: Married [place] name: Baller Is patient pregnant?: Yes Pregnancy Status: Unknown Living Arrangements: Spouse/significant other, Children, Other (Comment)(Inlaws) Can pt return to current living arrangement?: Yes Admission Status: Involuntary Is patient capable of signing voluntary admission?: Yes Referral Source: Self/Family/Friend Insurance type: None  Medical Screening Exam [facility] Walk-in ONLY) Medical Exam completed: Yes  Crisis Care Plan Living Arrangements: Spouse/significant other, Children, Other (Comment)(Inlaws) Legal Guardian: Other:(Self) Name of Psychiatrist: [facility] Name of Therapist: [facility]  Education Status Is patient currently in school?: Yes Current Grade: n/a Highest grade of school patient has completed: Some Automotive engineer Name of school: [employer] person: n/a  Risk to self with the past 6 months Suicidal Ideation: No  Has patient been a risk to self within the past 6 months prior to admission? : No Suicidal Intent: No Has patient had any suicidal intent within the past 6 months prior to admission? : No Is patient at risk for suicide?: No Suicidal Plan?: No Has patient had any  suicidal plan within the past 6 months prior to admission? : No Access to Means: No What has been your use of drugs/alcohol within the last 12 months?: denied use of alcohol or drugs Previous Attempts/Gestures: No How many times?: 0 Other Self Harm Risks: denied Triggers for Past Attempts: None known Intentional Self Injurious Behavior: None Family Suicide History: No Recent stressful life event(s): Other (Comment)(Lack of sleep) Persecutory voices/beliefs?: No Depression: No Depression Symptoms: (Denied) Substance abuse history and/or treatment for substance abuse?: No Suicide prevention information given to non-admitted patients: Not applicable  Risk to Others within the past 6 months Homicidal Ideation: No Does patient have any lifetime risk of violence toward others beyond the six months prior to admission? : No Thoughts of Harm to Others: No Current Homicidal Intent: No Current Homicidal Plan: No Access to Homicidal Means: No Identified Victim: None identified History of harm to others?: No Assessment of Violence: None Noted Does patient have access to weapons?: No Criminal Charges Pending?: No Does patient have a court date: No Is patient on probation?: No  Psychosis Hallucinations: None noted Delusions: None noted  Mental Status Report Appearance/Hygiene: In scrubs Eye Contact: Fair Motor Activity: Unremarkable Speech: Logical/coherent Level of Consciousness: Alert Mood: Euthymic Affect: Appropriate to circumstance Anxiety Level: None Thought Processes: Coherent Judgement: Partial Orientation: Appropriate for developmental age Obsessive Compulsive Thoughts/Behaviors: None  Cognitive Functioning Concentration: Poor Memory: Recent Intact IQ: Average Insight: Poor Impulse Control: Poor Appetite: Fair Sleep: Unable to Assess("I can't get no sleep") Total Hours of Sleep: 5 Vegetative Symptoms: None  ADLScreening [facility] Assessment Services) Patient's  cognitive ability adequate to safely complete daily activities?: Yes Patient able to express need for assistance with ADLs?: Yes Independently performs ADLs?: Yes (appropriate for developmental age)  Prior Inpatient Therapy Prior Inpatient Therapy: No Prior Therapy Dates: n/a Prior Therapy Facilty/Provider(s): n/a Reason for Treatment: n/a  Prior Outpatient Therapy Prior Outpatient Therapy: Yes Prior Therapy Dates: Current Prior Therapy Facilty/Provider(s): [facility] Reason for Treatment: Unsure Does patient have an ACCT team?: No Does patient have Intensive In-House Services?  : No Does patient have [facility] services? : No Does patient have P4CC services?: No  ADL Screening (condition at time of admission) Patient's cognitive ability adequate to safely complete daily activities?: Yes Is the patient deaf or have difficulty hearing?: No Does the patient have difficulty seeing, even when wearing glasses/contacts?: No Does the patient have difficulty concentrating, remembering, or making decisions?: No Patient able to express need for assistance with ADLs?: Yes Independently performs ADLs?: Yes (appropriate for developmental age) Does the patient have difficulty walking or climbing stairs?: No Weakness of Legs: None Weakness of Arms/Hands: Left(General weakness)  Home Assistive Devices/Equipment Home Assistive Devices/Equipment: None    Abuse/Neglect Assessment (Assessment to be complete while patient is alone) Abuse/Neglect Assessment Can Be Completed: (Denied a history of abuse)     Merchant navy officerAdvance Directives (For Healthcare) Does Patient Have a Medical Advance Directive?: No Would patient like information on creating a medical advance directive?: No - Patient declined    Additional Information 1:1 In Past 12 Months?: No CIRT Risk: No Elopement Risk: No Does patient have medical clearance?: Yes     Disposition:  Disposition Initial Assessment Completed  for this Encounter:  Yes Disposition of Patient: Inpatient treatment program Type of inpatient treatment program: Adult  On Site Evaluation by:   Reviewed with Physician:    Justice Deeds 07/02/2017 8:10 PM

## 2017-07-02 NOTE — ED Notes (Signed)
Patient is laughing and giggling, making faces, sticking tongue out etc and hand gestures, using both hands to form a triangle, at [facility].

## 2017-07-02 NOTE — ED Provider Notes (Signed)
Patient became acutely agitated, not redirectable.  Patient yelling at staff.  Posturing.  Patient danger to herself and others.  I reviewed her dose of Geodon earlier 10 mg.  Unfortunately she is extremely labile and agitated and dangerous both to self and staff right now.  Patient will be given injectable medication Geodon additional 10 mg, as well as Ativan and Benadryl for safety of patient.   Governor RooksLord, Arby Dahir, MD 07/02/17 [phone]

## 2017-07-03 DIAGNOSIS — F319 Bipolar disorder, unspecified: Secondary | ICD-10-CM | POA: Diagnosis not present

## 2017-07-03 DIAGNOSIS — Z91199 Patient's noncompliance with other medical treatment and regimen due to unspecified reason: Secondary | ICD-10-CM

## 2017-07-03 DIAGNOSIS — F1210 Cannabis abuse, uncomplicated: Secondary | ICD-10-CM

## 2017-07-03 DIAGNOSIS — Z9119 Patient's noncompliance with other medical treatment and regimen: Secondary | ICD-10-CM

## 2017-07-03 MED ORDER — HALOPERIDOL DECANOATE 100 MG/ML IM SOLN
50.0000 mg | Freq: Once | INTRAMUSCULAR | Status: AC
  Administered 2017-07-03: 50 mg via INTRAMUSCULAR
  Filled 2017-07-03: qty 0.5

## 2017-07-03 MED ORDER — DIPHENHYDRAMINE HCL 50 MG PO CAPS: 50.0000 mg | ORAL_CAPSULE | Freq: Every day | ORAL | 2 refills | Status: DC

## 2017-07-03 MED ORDER — HALOPERIDOL 5 MG PO TABS: 5.0000 mg | ORAL_TABLET | Freq: Every day | ORAL | 2 refills | Status: DC

## 2017-07-03 MED ORDER — HALOPERIDOL DECANOATE 100 MG/ML IM SOLN: 50.0000 mg | INTRAMUSCULAR | 5 refills | Status: DC

## 2017-07-03 NOTE — Progress Notes (Signed)
Patient ID: Jody Eaton, female   DOB: 08-20-1980, 37 y.o.   MRN: [id number] To whom it concerns: Jody Eaton date of birth 08-20-1980, has been in the emergency room since the early morning of February 24.  She is being discharged today, afternoon of February 25.  She can return to work and school at regular schedule. Maylie Ashton,MD

## 2017-07-03 NOTE — ED Notes (Addendum)
Patient voices understanding of discharge instructions, all prescriptions given to Patient, all belongings given back to Patient, Patient transported home with family member. Patient with work and school note and she also tolerated injection without difficulty, Patient is smiling and glad to be going back home.

## 2017-07-03 NOTE — ED Notes (Signed)
Report to oncoming nurses. 

## 2017-07-03 NOTE — ED Provider Notes (Signed)
I spoke with Dr. Toni Amendlapacs, psychiatry, patient planned for outpatient management.  He will give her prescriptions.  She is to take her Haldol injection here prior to discharge.   Governor RooksLord, Jody Millirons, MD 07/03/17 [phone]

## 2017-07-03 NOTE — ED Notes (Signed)
Pt. Transferred to [facility] from ED to room 7 after screening for contraband. Report to include Situation, Background, Assessment and Recommendations [facility]. Pt. Oriented to unit including Q15 minute rounds as well as the security cameras for their protection. Patient is alert and oriented, warm and dry in no acute distress. Patient denies SI, HI, and AVH. Pt. Encouraged to let me know if needs arise.

## 2017-07-03 NOTE — ED Notes (Signed)
Per Quale MD, pt is to have repeat EKG at 0800. MD order is not for this time per Quale.

## 2017-07-03 NOTE — ED Notes (Signed)
Patient's mom here to visit in dayroom, visit was supervised.

## 2017-07-03 NOTE — ED Notes (Signed)
Patient is alert and oriented, no signs of distress.

## 2017-07-03 NOTE — ED Notes (Signed)
Pt resting in bed. NAD. Monitored on camera and 15 minute checks

## 2017-07-03 NOTE — ED Notes (Signed)
Per MD, do not wake pt up for medication. Pt has woken up to urinate when this RN in with other pt. Pt back to sleep at this time.

## 2017-07-03 NOTE — Consult Note (Signed)
[facility] Consult   Reason for Consult: Consult for 37 year old woman who came in over the weekend with agitation and manic-like symptoms Referring Physician: Reita Cliche Patient Identification: Jody Eaton MRN:  [id number] Principal Diagnosis: Bipolar I disorder ([place]) Diagnosis:   Patient Active Problem List   Diagnosis Date Noted  . Noncompliance [Z91.19] 07/03/2017  . Cannabis abuse [F12.10] 07/03/2017  . Tobacco use disorder [F17.200] 05/02/2016  . Bipolar I disorder ([place]) [F31.9] 05/02/2016    Total Time spent with patient: 1 hour  Subjective:   Jody Eaton is a 37 y.o. female patient admitted with "I guess I could not sleep for a couple of days".  HPI: Patient interviewed chart reviewed.  Patient came in over the weekend brought in by her sister.  Report was that she was hyperactive hyperverbal labile in her behavior.  There were descriptions of her preaching to other patients in the unit.  Family reported she had been not sleeping for days not eating well and acting strangely.  Here in the emergency room patient as late as yesterday was documented as having inappropriate anger outbursts.  However, there is no report of her actually being violent to anyone or hurting herself.  She has been compliant with medicines being in the emergency room.  Today the patient seems to have resolved pretty much all of her acute symptoms.  She tells me that she did not sleep well for several days.  She is aware that she has been overworking herself.  She is working as a Systems analyst 7 days a week and also going to school several days a week as well as working to take care of her 15-year-old child.  She also admits that she was not eating healthfully.  She knows that she gets agitated when she is not sleeping but has no memory of doing anything particularly bizarre.  Denies any substance abuse.  Patient admits that she has not been taking her psychiatric medicine" quite a while".   Nevertheless she is willing and agreeable to restarting medicine here in the emergency room.  Social history: Patient indicates that she lives with her husband and HER-2 younger children ages 42 and 16.  Sister is closely involved in her care as well.  Patient says she is working as a Systems analyst and also going to school to get a certificate to improve her job opportunities.  Medical history: Denies any significant medical problems outside of the mental health  Substance abuse history: Patient denies any alcohol or drug abuse.  Her drug screen is positive for cannabis.  She has a past history of cannabis abuse but no other past serious drug problems.  Past Psychiatric History: Patient has an established history of intermittent presentation with symptoms of either depression or mania.  He has been treated for psychotic depression in the past and has also presented with manic-like symptoms.  It sounds like she had been doing fairly well off of her medicine in a quiet sent phase of what is probably bipolar disorder but the last couple days did not sleep and flipped into a manic presentation.  No known history of actual violence to anyone else or suicidal behavior.  Last time she was admitted here she was discharged on Haldol Decanoate.  She says that she knows that she ought to be getting her "shot" but she has not been following up with [facility] because it is not convenient to her.  Risk to Self: Suicidal Ideation: No Suicidal  Intent: No Is patient at risk for suicide?: No Suicidal Plan?: No Access to Means: No What has been your use of drugs/alcohol within the last 12 months?: denied use of alcohol or drugs How many times?: 0 Other Self Harm Risks: denied Triggers for Past Attempts: None known Intentional Self Injurious Behavior: None Risk to Others: Homicidal Ideation: No Thoughts of Harm to Others: No Current Homicidal Intent: No Current Homicidal Plan: No Access to Homicidal Means:  No Identified Victim: None identified History of harm to others?: No Assessment of Violence: None Noted Does patient have access to weapons?: No Criminal Charges Pending?: No Does patient have a court date: No Prior Inpatient Therapy: Prior Inpatient Therapy: No Prior Therapy Dates: n/a Prior Therapy Facilty/Provider(s): n/a Reason for Treatment: n/a Prior Outpatient Therapy: Prior Outpatient Therapy: Yes Prior Therapy Dates: Current Prior Therapy Facilty/Provider(s): [facility] Reason for Treatment: Unsure Does patient have an ACCT team?: No Does patient have Intensive In-House Services?  : No Does patient have [facility] services? : No Does patient have P4CC services?: No  Past Medical History:  Past Medical History:  Diagnosis Date  . Bipolar 1 disorder ([place])   . Schizophrenia ([place])    History reviewed. No pertinent surgical history. Family History: No family history on file. Family Psychiatric  History: Does not know of any family history of mental health problems Social History:  Social History   Substance and Sexual Activity  Alcohol Use No     Social History   Substance and Sexual Activity  Drug Use No    Social History   Socioeconomic History  . Marital status: Single    Spouse name: None  . Number of children: None  . Years of education: None  . Highest education level: None  Social Needs  . Financial resource strain: None  . Food insecurity - worry: None  . Food insecurity - inability: None  . Transportation needs - medical: None  . Transportation needs - non-medical: None  Occupational History  . None  Tobacco Use  . Smoking status: Light Tobacco Smoker    Packs/day: 0.00    Years: 1.00    Pack years: 0.00  . Smokeless tobacco: Never Used  Substance and Sexual Activity  . Alcohol use: No  . Drug use: No  . Sexual activity: Yes    Birth control/protection: Condom  Other Topics Concern  . None  Social History Narrative  . None   Additional Social  History:    Allergies:  No Known Allergies  Labs:  Results for orders placed or performed during the hospital encounter of 07/02/17 (from the past 48 hour(s))  Comprehensive metabolic panel     Status: Abnormal   Collection Time: 07/02/17  2:25 AM  Result Value Ref Range   Sodium 138 135 - 145 mmol/L   Potassium 3.0 (L) 3.5 - 5.1 mmol/L   Chloride 104 101 - 111 mmol/L   CO2 25 22 - 32 mmol/L   Glucose, Bld 129 (H) 65 - 99 mg/dL   BUN 6 6 - 20 mg/dL   Creatinine, Ser 1.02 (H) 0.44 - 1.00 mg/dL   Calcium 9.4 8.9 - 10.3 mg/dL   Total Protein 8.4 (H) 6.5 - 8.1 g/dL   Albumin 4.4 3.5 - 5.0 g/dL   AST 47 (H) 15 - 41 U/L   ALT 23 14 - 54 U/L   Alkaline Phosphatase 48 38 - 126 U/L   Total Bilirubin 0.6 0.3 - 1.2 mg/dL   GFR calc  non Af Amer >60 >60 mL/min   GFR calc Af Amer >60 >60 mL/min    Comment: (NOTE) The eGFR has been calculated using the CKD EPI equation. This calculation has not been validated in all clinical situations. eGFR's persistently <60 mL/min signify possible Chronic Kidney Disease.    Anion gap 9 5 - 15    Comment: Performed at [facility], [address] [place], [place] [address]  Ethanol     Status: None   Collection Time: 07/02/17  2:25 AM  Result Value Ref Range   Alcohol, Ethyl (B) <10 <10 mg/dL    Comment:        LOWEST DETECTABLE LIMIT FOR SERUM ALCOHOL IS 10 mg/dL FOR MEDICAL PURPOSES ONLY Performed at [facility], [address] [place], [place] [address]   cbc     Status: Abnormal   Collection Time: 07/02/17  2:25 AM  Result Value Ref Range   WBC 13.3 (H) 3.6 - 11.0 K/uL   RBC 4.49 3.80 - 5.20 MIL/uL   Hemoglobin 14.5 12.0 - 16.0 g/dL   HCT 43.1 35.0 - 47.0 %   MCV 96.0 80.0 - 100.0 fL   MCH 32.2 26.0 - 34.0 pg   MCHC 33.5 32.0 - 36.0 g/dL   RDW 13.9 11.5 - 14.5 %   Platelets 338 150 - 440 K/uL    Comment: Performed at [facility], [address] [place], [place] [address]  Acetaminophen level      Status: Abnormal   Collection Time: 07/02/17  2:25 AM  Result Value Ref Range   Acetaminophen (Tylenol), Serum <10 (L) 10 - 30 ug/mL    Comment:        THERAPEUTIC CONCENTRATIONS VARY SIGNIFICANTLY. A RANGE OF 10-30 ug/mL MAY BE AN EFFECTIVE CONCENTRATION FOR MANY PATIENTS. HOWEVER, SOME ARE BEST TREATED AT CONCENTRATIONS OUTSIDE THIS RANGE. ACETAMINOPHEN CONCENTRATIONS >150 ug/mL AT 4 HOURS AFTER INGESTION AND >50 ug/mL AT 12 HOURS AFTER INGESTION ARE OFTEN ASSOCIATED WITH TOXIC REACTIONS. Performed at [facility], [address] [place], [place] [address]   Salicylate level     Status: None   Collection Time: 07/02/17  2:25 AM  Result Value Ref Range   Salicylate Lvl <9.4 2.8 - 30.0 mg/dL    Comment: Performed at [facility], [address] [place], [place] [address]  Urine Drug Screen, Qualitative     Status: Abnormal   Collection Time: 07/02/17  3:22 AM  Result Value Ref Range   Tricyclic, Ur Screen NONE DETECTED NONE DETECTED   Amphetamines, Ur Screen NONE DETECTED NONE DETECTED   MDMA (Ecstasy)Ur Screen NONE DETECTED NONE DETECTED   Cocaine Metabolite,Ur [place] NONE DETECTED NONE DETECTED   Opiate, Ur Screen NONE DETECTED NONE DETECTED   Phencyclidine (PCP) Ur S NONE DETECTED NONE DETECTED   Cannabinoid 50 Ng, Ur  POSITIVE (A) NONE DETECTED   Barbiturates, Ur Screen NONE DETECTED NONE DETECTED   Benzodiazepine, Ur Scrn NONE DETECTED NONE DETECTED   Methadone Scn, Ur NONE DETECTED NONE DETECTED    Comment: (NOTE) Tricyclics + metabolites, urine    Cutoff 1000 ng/mL Amphetamines + metabolites, urine  Cutoff 1000 ng/mL MDMA (Ecstasy), urine              Cutoff 500 ng/mL Cocaine Metabolite, urine          Cutoff 300 ng/mL Opiate + metabolites, urine        Cutoff 300 ng/mL Phencyclidine (PCP), urine  Cutoff 25 ng/mL Cannabinoid, urine                 Cutoff 50 ng/mL Barbiturates + metabolites, urine  Cutoff 200 ng/mL Benzodiazepine, urine               Cutoff 200 ng/mL Methadone, urine                   Cutoff 300 ng/mL The urine drug screen provides only a preliminary, unconfirmed analytical test result and should not be used for non-medical purposes. Clinical consideration and professional judgment should be applied to any positive drug screen result due to possible interfering substances. A more specific alternate chemical method must be used in order to obtain a confirmed analytical result. Gas chromatography / mass spectrometry (GC/MS) is the preferred confirmat ory method. Performed at [facility], [address] [place], [place] [address]   Urinalysis, Complete w Microscopic     Status: Abnormal   Collection Time: 07/02/17  3:22 AM  Result Value Ref Range   Color, Urine STRAW (A) YELLOW   APPearance CLEAR (A) CLEAR   Specific Gravity, Urine 1.002 (L) 1.005 - 1.030   pH 6.0 5.0 - 8.0   Glucose, UA NEGATIVE NEGATIVE mg/dL   Hgb urine dipstick LARGE (A) NEGATIVE   Bilirubin Urine NEGATIVE NEGATIVE   Ketones, ur NEGATIVE NEGATIVE mg/dL   Protein, ur NEGATIVE NEGATIVE mg/dL   Nitrite NEGATIVE NEGATIVE   Leukocytes, UA NEGATIVE NEGATIVE   RBC / HPF NONE SEEN 0 - 5 RBC/hpf   WBC, UA 0-5 0 - 5 WBC/hpf   Bacteria, UA RARE (A) NONE SEEN   Squamous Epithelial / LPF NONE SEEN NONE SEEN    Comment: Performed at [facility], [address] [place], [place] [address]  Pregnancy, urine POC     Status: None   Collection Time: 07/02/17  3:35 AM  Result Value Ref Range   Preg Test, Ur NEGATIVE NEGATIVE    Comment:        THE SENSITIVITY OF THIS METHODOLOGY IS >24 mIU/mL     Current Facility-Administered Medications  Medication Dose Route Frequency Provider Last Rate Last Dose  . haloperidol decanoate (HALDOL DECANOATE) 100 MG/ML injection 50 mg  50 mg Intramuscular Once Tanza Pellot T, MD      . OLANZapine (ZYPREXA) tablet 5 mg  5 mg Oral Q1200 Delman Kitten, MD   5 mg at 07/03/17 1232  .  OLANZapine (ZYPREXA) tablet 5 mg  5 mg Oral QHS Delman Kitten, MD   Stopped at 07/03/17 0200   Current Outpatient Medications  Medication Sig Dispense Refill  . diphenhydrAMINE (BENADRYL) 50 MG capsule Take 1 capsule (50 mg total) by mouth at bedtime. 30 capsule 2  . haloperidol (HALDOL) 5 MG tablet Take 1 tablet (5 mg total) by mouth at bedtime. 30 tablet 2  . haloperidol decanoate (HALDOL DECANOATE) 100 MG/ML injection Inject 0.5 mLs (50 mg total) into the muscle every 28 (twenty-eight) days. 1 mL 5    Musculoskeletal: Strength & Muscle Tone: within normal limits Gait & Station: normal Patient leans: N/A  Psychiatric Specialty Exam: Physical Exam  Nursing note and vitals reviewed. Constitutional: She appears well-developed and well-nourished.  HENT:  Head: Normocephalic and atraumatic.  Eyes: Conjunctivae are normal. Pupils are equal, round, and reactive to light.  Neck: Normal range of motion.  Cardiovascular: Regular rhythm and normal heart sounds.  Respiratory: Effort normal. No respiratory distress.  GI: Soft.  Musculoskeletal:  Normal range of motion.  Neurological: She is alert.  Skin: Skin is warm and dry.  Psychiatric: She has a normal mood and affect. Her speech is normal and behavior is normal. Judgment and thought content normal. Cognition and memory are normal.    Review of Systems  Constitutional: Negative.   HENT: Negative.   Eyes: Negative.   Respiratory: Negative.   Cardiovascular: Negative.   Gastrointestinal: Negative.   Musculoskeletal: Negative.   Skin: Negative.   Neurological: Negative.   Psychiatric/Behavioral: Positive for memory loss. Negative for depression, hallucinations, substance abuse and suicidal ideas. The patient has insomnia. The patient is not nervous/anxious.     Blood pressure 121/73, pulse 78, temperature 98.6 F (37 C), temperature source Oral, resp. rate 18, height 5' 2"  (1.575 m), weight 59 kg (130 lb), last menstrual period  07/02/2017, SpO2 100 %.Body mass index is 23.78 kg/m.  General Appearance: Fairly Groomed  Eye Contact:  Good  Speech:  Clear and Coherent  Volume:  Normal  Mood:  Euthymic  Affect:  Appropriate  Thought Process:  Goal Directed  Orientation:  Full (Time, Place, and Person)  Thought Content:  Logical  Suicidal Thoughts:  No  Homicidal Thoughts:  No  Memory:  Immediate;   Good Recent;   Fair Remote;   Fair  Judgement:  Fair  Insight:  Fair  Psychomotor Activity:  Normal  Concentration:  Concentration: Fair  Recall:  [employer] of Knowledge:  Fair  Language:  Fair  Akathisia:  No  Handed:  Right  AIMS (if indicated):     Assets:  Communication Skills Housing Physical Health Resilience Social Support  ADL's:  Intact  Cognition:  WNL  Sleep:        Treatment Plan Summary: Medication management and Plan 37 year old woman with a history of what sounds like bipolar disorder.  From what I can see in the chart, up until and through yesterday she was still showing obvious signs of mania but she also has been cooperative with the medicine that was ordered for her.  Today she seems to have resolved all of her symptoms.  I talked with her for quite a while and could not detect any acute signs or behaviors of mania or sign of dangerousness.  I counseled her about bipolar disorder and the importance of ongoing medication management.  She expressed understanding and agreement.  She was agreeable to my request that she let us give her a Haldol Decanoate shot.  I am also printing out prescriptions for Haldol Decanoate as well as the oral Haldol she was taking previously.  Patient had last been known to be following up at [facility] but tells me that now she lives in [place].  I am not sure if she moved or what happened but usually person [place] clients are seen through [facility].  I advised her that she should contact [facility] in [place] and that would be a much more convenient place to  get her mental health treatment.  Case reviewed with emergency room physician.  Patient was taken off of IVC and I recommend she can be discharged to outpatient care.  Disposition: No evidence of imminent risk to self or others at present.   Patient does not meet criteria for psychiatric inpatient admission. Supportive therapy provided about ongoing stressors.  Alethia Berthold, MD 07/03/2017 3:26 PM

## 2017-07-03 NOTE — Discharge Instructions (Signed)
Return to the emergency department immediately for any worsening condition including abnormal behavior, agitation, thoughts or behavior dangerous to self or others.

## 2017-07-03 NOTE — ED Provider Notes (Signed)
Vitals:   07/02/17 0807 07/03/17 0636  BP: 111/87 103/71  Pulse: (!) 110 100  Resp: 18 16  Temp: 98 F (36.7 C) 97.7 F (36.5 C)  SpO2: 98% 99%     No acute events reported to me overnight from nursing or physician sign out.  Patient on IVC awaiting psych dispo with TTS working on disposition.   Governor RooksLord, Bodie Abernethy, MD 07/03/17 1415

## 2017-07-03 NOTE — ED Notes (Signed)
Pt oob for shower. Denies needs or wants. No complaints. NAD. Monitored via camera and 15 min checks.

## 2018-02-06 ENCOUNTER — Emergency Department
Admission: EM | Admit: 2018-02-06 | Discharge: 2018-02-07 | Disposition: A | Discharge status: Home / Self Care | Payer: Medicaid Other | Attending: Emergency Medicine | Admitting: Emergency Medicine

## 2018-02-06 ENCOUNTER — Encounter: Payer: Self-pay | Admitting: Emergency Medicine

## 2018-02-06 DIAGNOSIS — Z91199 Patient's noncompliance with other medical treatment and regimen due to unspecified reason: Secondary | ICD-10-CM

## 2018-02-06 DIAGNOSIS — F319 Bipolar disorder, unspecified: Secondary | ICD-10-CM | POA: Diagnosis present

## 2018-02-06 DIAGNOSIS — Z9119 Patient's noncompliance with other medical treatment and regimen: Secondary | ICD-10-CM

## 2018-02-06 DIAGNOSIS — F3010 Manic episode without psychotic symptoms, unspecified: Secondary | ICD-10-CM

## 2018-02-06 DIAGNOSIS — Z79899 Other long term (current) drug therapy: Secondary | ICD-10-CM | POA: Insufficient documentation

## 2018-02-06 DIAGNOSIS — F17210 Nicotine dependence, cigarettes, uncomplicated: Secondary | ICD-10-CM | POA: Insufficient documentation

## 2018-02-06 DIAGNOSIS — F1210 Cannabis abuse, uncomplicated: Secondary | ICD-10-CM | POA: Diagnosis present

## 2018-02-06 LAB — COMPREHENSIVE METABOLIC PANEL
ALT: 14 U/L (ref 0–44)
AST: 26 U/L (ref 15–41)
Albumin: 4.2 g/dL (ref 3.5–5.0)
Alkaline Phosphatase: 48 U/L (ref 38–126)
Anion gap: 12 (ref 5–15)
CHLORIDE: 108 mmol/L (ref 98–111)
CO2: 19 mmol/L — ABNORMAL LOW (ref 22–32)
Calcium: 9.4 mg/dL (ref 8.9–10.3)
Creatinine, Ser: 0.93 mg/dL (ref 0.44–1.00)
GFR calc Af Amer: >60 mL/min (ref >60)
Glucose, Bld: 116 mg/dL — ABNORMAL HIGH (ref 70–99)
POTASSIUM: 3.1 mmol/L — AB (ref 3.5–5.1)
Sodium: 139 mmol/L (ref 135–145)
Total Bilirubin: 0.7 mg/dL (ref 0.3–1.2)
Total Protein: 7.9 g/dL (ref 6.5–8.1)

## 2018-02-06 LAB — CBC WITH DIFFERENTIAL/PLATELET
BASOS ABS: 0.1 10*3/uL (ref 0–0.1)
BASOS PCT: 1 %
EOS PCT: 0 %
Eosinophils Absolute: 0.1 10*3/uL (ref 0–0.7)
HCT: 39.5 % (ref 35.0–47.0)
Hemoglobin: 13.7 g/dL (ref 12.0–16.0)
LYMPHS PCT: 32 %
Lymphs Abs: 4.6 10*3/uL — ABNORMAL HIGH (ref 1.0–3.6)
MCH: 34.1 pg — ABNORMAL HIGH (ref 26.0–34.0)
MCHC: 34.6 g/dL (ref 32.0–36.0)
MCV: 98.5 fL (ref 80.0–100.0)
MONO ABS: 1.2 10*3/uL — AB (ref 0.2–0.9)
Monocytes Relative: 9 %
Neutro Abs: 8.2 10*3/uL — ABNORMAL HIGH (ref 1.4–6.5)
Neutrophils Relative %: 58 %
Platelets: 333 10*3/uL (ref 150–440)
RBC: 4.01 MIL/uL (ref 3.80–5.20)
RDW: 14 % (ref 11.5–14.5)
WBC: 14.1 10*3/uL — ABNORMAL HIGH (ref 3.6–11.0)

## 2018-02-06 LAB — URINALYSIS, COMPLETE (UACMP) WITH MICROSCOPIC
Bacteria, UA: NONE SEEN
Bilirubin Urine: NEGATIVE
Glucose, UA: NEGATIVE mg/dL
KETONES UR: NEGATIVE mg/dL
Leukocytes, UA: NEGATIVE
Nitrite: NEGATIVE
PROTEIN: NEGATIVE mg/dL
Specific Gravity, Urine: 1.002 — ABNORMAL LOW (ref 1.005–1.030)
pH: 6 (ref 5.0–8.0)

## 2018-02-06 LAB — URINE DRUG SCREEN, QUALITATIVE (ARMC ONLY)
Amphetamines, Ur Screen: NOT DETECTED
BENZODIAZEPINE, UR SCRN: NOT DETECTED
Barbiturates, Ur Screen: NOT DETECTED
CANNABINOID 50 NG, UR ~~LOC~~: POSITIVE — AB
Cocaine Metabolite,Ur ~~LOC~~: NOT DETECTED
MDMA (Ecstasy)Ur Screen: NOT DETECTED
Methadone Scn, Ur: NOT DETECTED
Opiate, Ur Screen: NOT DETECTED
Phencyclidine (PCP) Ur S: NOT DETECTED
Tricyclic, Ur Screen: NOT DETECTED

## 2018-02-06 LAB — ETHANOL: ALCOHOL ETHYL (B): 86 mg/dL — AB (ref <10)

## 2018-02-06 MED ORDER — HALOPERIDOL LACTATE 5 MG/ML IJ SOLN
5.0000 mg | Freq: Three times a day (TID) | INTRAMUSCULAR | Status: DC | PRN
Start: 2018-02-06 — End: 2018-02-07

## 2018-02-06 MED ORDER — POTASSIUM CHLORIDE CRYS ER 20 MEQ PO TBCR
40.0000 meq | EXTENDED_RELEASE_TABLET | Freq: Once | ORAL | Status: AC
  Administered 2018-02-06: 40 meq via ORAL
  Filled 2018-02-06: qty 2

## 2018-02-06 MED ORDER — LORAZEPAM 1 MG PO TABS
1.0000 mg | ORAL_TABLET | Freq: Every day | ORAL | Status: DC
  Administered 2018-02-07: 1 mg via ORAL
  Filled 2018-02-06: qty 1

## 2018-02-06 MED ORDER — OLANZAPINE 10 MG PO TABS
10.0000 mg | ORAL_TABLET | Freq: Every day | ORAL | Status: DC
  Administered 2018-02-07: 10 mg via ORAL
  Filled 2018-02-06: qty 1

## 2018-02-06 NOTE — ED Notes (Signed)
IVC PENDING  CONSULT ?

## 2018-02-06 NOTE — ED Triage Notes (Signed)
PT arrived voluntarily with family. Pt denies SI/Hi but states she is manic depressive and hasn't slept in days.

## 2018-02-06 NOTE — ED Notes (Signed)
Writer gave patient meal tray and drink.  Patient calm and cooperative.

## 2018-02-06 NOTE — ED Notes (Signed)
SOC complete.  Received call from Dr Maricela Bo stating patient was pretending to be asleep and would not participate in [facility].

## 2018-02-06 NOTE — ED Notes (Signed)
Mother Supriya Beaston [phone]

## 2018-02-06 NOTE — ED Notes (Signed)
SOC in progress with Dr. Sprague 

## 2018-02-06 NOTE — ED Notes (Signed)
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
## 2018-02-06 NOTE — ED Notes (Signed)
Pt dressed out into appropriate behavioral health clothing with this tech and Kailey,RN in the rm. Pt belongings consist of a set of white earrings, a white necklace with a cross charm on it, black shoes, red shirt, black pants, white socks, red bra, white ear phones attached to cell phone,a chap stick and red panties. Belongings placed into a pt belonging bag and labeled. Pt calm and cooperative while dressing out.

## 2018-02-06 NOTE — ED Notes (Signed)
SOC called to JoAnn. 
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
## 2018-02-06 NOTE — ED Provider Notes (Addendum)
[facility] Emergency Department Provider Note  ____________________________________________  Time seen: Approximately 8:33 PM  I have reviewed the triage vital signs and the nursing notes.   HISTORY  Chief Complaint behavioral evaluation  Level 5 caveat:  Portions of the history and physical were unable to be obtained due to agitation and uncooperation   HPI Jody Eaton is a 37 y.o. female with history of bipolar and schizophrenia who presents for evaluation of manic behavior.  Patient reports that she has been unable to sleep since last night, according to the family she has been very agitated, pacing up and down the room, laughing inappropriately.  She denies any suicidal homicidal ideations.  Patient is here voluntarily. She denies hallucinations.  She endorses compliance with her medications.  Denies drug use.  Past Medical History:  Diagnosis Date  . Bipolar 1 disorder (HCC)   . Schizophrenia [facility])     Patient Active Problem List   Diagnosis Date Noted  . Noncompliance 07/03/2017  . Cannabis abuse 07/03/2017  . Tobacco use disorder 05/02/2016  . Bipolar I disorder (HCC) 05/02/2016    Prior to Admission medications   Medication Sig Start Date End Date Taking? Authorizing Provider  diphenhydrAMINE (BENADRYL) 50 MG capsule Take 1 capsule (50 mg total) by mouth at bedtime. 07/03/17   Clapacs, Jackquline Denmark, MD  haloperidol (HALDOL) 5 MG tablet Take 1 tablet (5 mg total) by mouth at bedtime. 07/03/17   Clapacs, Jackquline Denmark, MD  haloperidol decanoate (HALDOL DECANOATE) 100 MG/ML injection Inject 0.5 mLs (50 mg total) into the muscle every 28 (twenty-eight) days. 07/03/17   Clapacs, Jackquline Denmark, MD    Allergies Patient has no known allergies.  No family history on file.  Social History Social History   Tobacco Use  . Smoking status: Light Tobacco Smoker    Packs/day: 0.00    Years: 1.00    Pack years: 0.00  . Smokeless tobacco: Never Used  Substance  Use Topics  . Alcohol use: No  . Drug use: No    Review of Systems  Constitutional: Negative for fever. Eyes: Negative for visual changes. ENT: Negative for sore throat. Neck: No neck pain  Cardiovascular: Negative for chest pain. Respiratory: Negative for shortness of breath. Gastrointestinal: Negative for abdominal pain, vomiting or diarrhea. Genitourinary: Negative for dysuria. Musculoskeletal: Negative for back pain. Skin: Negative for rash. Neurological: Negative for headaches, weakness or numbness. Psych: No SI or HI. + manic  ____________________________________________   PHYSICAL EXAM:  VITAL SIGNS: ED Triage Vitals  Enc Vitals Group     BP 02/06/18 1818 (!) 136/96     Pulse Rate 02/06/18 1818 (!) 140     Resp 02/06/18 1818 19     Temp 02/06/18 1818 99 F (37.2 C)     Temp Source 02/06/18 1818 Oral     SpO2 02/06/18 1818 98 %     Weight 02/06/18 1819 140 lb (63.5 kg)     Height 02/06/18 1819 5\' 2"  (1.575 m)     Head Circumference --      Peak Flow --      Pain Score 02/06/18 1819 0     Pain Loc --      Pain Edu? --      Excl. in GC? --     Constitutional: Alert and oriented, pacing, laughing loud and inappropriately.  HEENT:      Head: Normocephalic and atraumatic.         Eyes: Conjunctivae  are normal. Sclera is non-icteric.       Mouth/Throat: Mucous membranes are moist.       Neck: Supple with no signs of meningismus. Cardiovascular: Regular rate and rhythm. No murmurs, gallops, or rubs. 2+ symmetrical distal pulses are present in all extremities. No JVD. Respiratory: Normal respiratory effort. Lungs are clear to auscultation bilaterally. No wheezes, crackles, or rhonchi.  Gastrointestinal: Soft, non tender, and non distended with positive bowel sounds. No rebound or guarding. Genitourinary: No CVA tenderness. Musculoskeletal: Nontender with normal range of motion in all extremities. No edema, cyanosis, or erythema of extremities. Neurologic: Normal  speech and language. Face is symmetric. Moving all extremities. No gross focal neurologic deficits are appreciated. Skin: Skin is warm, dry and intact. No rash noted. Psychiatric: Mood and affect are elevated.  Coming in out of her room, laughing loud in the hallway inappropriately  ____________________________________________   LABS (all labs ordered are listed, but only abnormal results are displayed)  Labs Reviewed  CBC WITH DIFFERENTIAL/PLATELET - Abnormal; Notable for the following components:      Result Value   WBC 14.1 (*)    MCH 34.1 (*)    Neutro Abs 8.2 (*)    Lymphs Abs 4.6 (*)    Monocytes Absolute 1.2 (*)    All other components within normal limits  COMPREHENSIVE METABOLIC PANEL - Abnormal; Notable for the following components:   Potassium 3.1 (*)    CO2 19 (*)    Glucose, Bld 116 (*)    BUN <5 (*)    All other components within normal limits  ETHANOL - Abnormal; Notable for the following components:   Alcohol, Ethyl (B) 86 (*)    All other components within normal limits  URINALYSIS, COMPLETE (UACMP) WITH MICROSCOPIC - Abnormal; Notable for the following components:   Color, Urine STRAW (*)    APPearance CLEAR (*)    Specific Gravity, Urine 1.002 (*)    Hgb urine dipstick SMALL (*)    All other components within normal limits  URINE DRUG SCREEN, QUALITATIVE ([facility] ONLY) - Abnormal; Notable for the following components:   Cannabinoid 50 Ng, Ur [place] POSITIVE (*)    All other components within normal limits   ____________________________________________  EKG  ED ECG REPORT I, Nita Sickle, the attending physician, personally viewed and interpreted this ECG.  Sinus tachycardia, rate of 133, normal intervals, normal axis, no ST elevations or depressions. ____________________________________________  RADIOLOGY  none ____________________________________________   PROCEDURES  Procedure(s) performed: None Procedures Critical Care performed:   None ____________________________________________   INITIAL IMPRESSION / ASSESSMENT AND PLAN / ED COURSE   37 y.o. female with history of bipolar and schizophrenia who presents for evaluation of manic behavior. Patient is manic and meets IVC criteria.  IVC papers were taken.  Labs showing mild leukocytosis with white count of 14.1 of unclear etiology, patient has no fever and no infectious complaints.  UA is negative for urinary tract infection.  Slightly low potassium which was supplemented p.o.  Alcohol level of 86.  Drug screen positive for marijuana.  Patient is medically clear awaiting psychiatric evaluation.  Clinical Course as of Feb 07 2331  Tue Feb 06, 2018  2310 Patient evaluated by Dr. Maricela Bo, psychiatrist who recommended admission to inpatient.   [CV]    Clinical Course User Index [CV] Don Perking [place], MD     As part of my medical decision making, I reviewed the following data within the electronic MEDICAL RECORD NUMBER Nursing notes reviewed and incorporated,  Labs reviewed , Old chart reviewed, A consult was requested and obtained from this/these consultant(s) Psychiatry, Notes from prior ED visits and [place] Controlled Substance Database    Pertinent labs & imaging results that were available during my care of the patient were reviewed by me and considered in my medical decision making (see chart for details).    ____________________________________________   FINAL CLINICAL IMPRESSION(S) / ED DIAGNOSES  Final diagnoses:  Manic behavior (HCC)      NEW MEDICATIONS STARTED DURING THIS VISIT:  ED Discharge Orders    None       Note:  This document was prepared using Dragon voice recognition software and may include unintentional dictation errors.    Nita Sickle, MD 02/06/18 2037    Nita Sickle, MD 02/06/18 2050    Nita Sickle, MD 02/06/18 [phone]

## 2018-02-07 DIAGNOSIS — F319 Bipolar disorder, unspecified: Secondary | ICD-10-CM

## 2018-02-07 MED ORDER — DIPHENHYDRAMINE HCL 50 MG PO CAPS: 50.0000 mg | ORAL_CAPSULE | Freq: Every day | ORAL | 2 refills | Status: DC

## 2018-02-07 NOTE — ED Notes (Signed)
Hourly rounding reveals patient sleeping in room. No complaints, stable, in no acute distress. Q15 minute rounds and monitoring via Security Cameras to continue. 

## 2018-02-07 NOTE — ED Notes (Signed)
Pt. Transferred to BHU from ED to room 5 after screening for contraband. Report to include Situation, Background, Assessment and Recommendations from Kenisha RN. Pt. Oriented to unit including Q15 minute rounds as well as the security cameras for their protection. Patient is alert and oriented, warm and dry in no acute distress. Patient denies SI, HI, and AVH. Pt. Encouraged to let me know if needs arise. 

## 2018-02-07 NOTE — Consult Note (Signed)
[facility] Consult   Reason for Consult: Consult for this 37 year old woman with a history of bipolar disorder who came to the emergency room because she is not sleeping Referring Physician: Joni Fears Patient Identification: CHRISA Eaton MRN:  [id number] Principal Diagnosis: Bipolar I disorder ([place]) Diagnosis:   Patient Active Problem List   Diagnosis Date Noted  . Noncompliance [Z91.19] 07/03/2017  . Cannabis abuse [F12.10] 07/03/2017  . Tobacco use disorder [F17.200] 05/02/2016  . Bipolar I disorder ([place]) [F31.9] 05/02/2016    Total Time spent with patient: 1 hour  Subjective:   Jody Eaton is a 37 y.o. female patient admitted with "I have not slept in a couple of days".  HPI: Patient seen chart reviewed.  Patient says she has not slept for a couple of days.  Her last Abilify shot from [place] was given last week but she is not taking anything for sleep.  Recent stress includes arguing more with her husband for the last few days.  She says that that has gotten to the point where she actually moved out a couple days ago.  Has not slept for the last couple days.  Denies however having any hallucinations delusions or psychotic symptoms.  Denies suicidal or homicidal thought.  Patient says that she finds that when she does not sleep she walks around staring at people and it makes the other people anxious and this is the symptom he wants to correct.  Medical history: No significant medical problems outside of the psychiatric  Substance abuse history: Ongoing marijuana use almost daily.  Has been probably associated with some of her problems in the past.  No other active drug abuse or alcohol use.  Social history: Patient says that she is supposed to be starting a job today.  She is separated from her husband and children.  She says she does have a stable place to live however with some of her other family.  Past Psychiatric History: Patient has a history of bipolar  disorder.  Last admission to the hospital was a couple years ago.  No history of suicide attempts in the past.  Has been treated with antipsychotics and mood stabilizers previously.  On previous admissions she was much more decompensated than she is right now with overt psychosis that she is not currently showing.  No history of violence.  Risk to Self: Suicidal Ideation: No Suicidal Intent: No Is patient at risk for suicide?: No Suicidal Plan?: No Access to Means: No What has been your use of drugs/alcohol within the last 12 months?: Cannabis How many times?: 0 Other Self Harm Risks: Reports of none Triggers for Past Attempts: Spouse contact Intentional Self Injurious Behavior: None Risk to Others: Homicidal Ideation: No Thoughts of Harm to Others: No Current Homicidal Intent: No Current Homicidal Plan: No Access to Homicidal Means: No Identified Victim: Reports of none History of harm to others?: No Assessment of Violence: None Noted Violent Behavior Description: Reports of none Does patient have access to weapons?: No Criminal Charges Pending?: No Does patient have a court date: No Prior Inpatient Therapy: Prior Inpatient Therapy: Yes Prior Therapy Dates: 04/2016 Prior Therapy Facilty/Provider(s): [facility] Reason for Treatment: Bipolar Prior Outpatient Therapy: Prior Outpatient Therapy: Yes Prior Therapy Dates: Current Prior Therapy Facilty/Provider(s): [facility] Reason for Treatment: Bipolar Does patient have an ACCT team?: No Does patient have Intensive In-House Services?  : No Does patient have [facility] services? : No Does patient have P4CC services?: No  Past Medical History:  Past Medical History:  Diagnosis Date  . Bipolar 1 disorder ([place])   . Schizophrenia ([place])    History reviewed. No pertinent surgical history. Family History: No family history on file. Family Psychiatric  History: None Social History:  Social History   Substance and Sexual Activity  Alcohol Use  No     Social History   Substance and Sexual Activity  Drug Use No    Social History   Socioeconomic History  . Marital status: Single    Spouse name: Not on file  . Number of children: Not on file  . Years of education: Not on file  . Highest education level: Not on file  Occupational History  . Not on file  Social Needs  . Financial resource strain: Not on file  . Food insecurity:    Worry: Not on file    Inability: Not on file  . Transportation needs:    Medical: Not on file    Non-medical: Not on file  Tobacco Use  . Smoking status: Light Tobacco Smoker    Packs/day: 0.00    Years: 1.00    Pack years: 0.00  . Smokeless tobacco: Never Used  Substance and Sexual Activity  . Alcohol use: No  . Drug use: No  . Sexual activity: Yes    Birth control/protection: Condom  Lifestyle  . Physical activity:    Days per week: Not on file    Minutes per session: Not on file  . Stress: Not on file  Relationships  . Social connections:    Talks on phone: Not on file    Gets together: Not on file    Attends religious service: Not on file    Active member of club or organization: Not on file    Attends meetings of clubs or organizations: Not on file    Relationship status: Not on file  Other Topics Concern  . Not on file  Social History Narrative  . Not on file   Additional Social History:    Allergies:  No Known Allergies  Labs:  Results for orders placed or performed during the hospital encounter of 02/06/18 (from the past 48 hour(s))  CBC with Differential     Status: Abnormal   Collection Time: 02/06/18  6:20 PM  Result Value Ref Range   WBC 14.1 (H) 3.6 - 11.0 K/uL   RBC 4.01 3.80 - 5.20 MIL/uL   Hemoglobin 13.7 12.0 - 16.0 g/dL   HCT 39.5 35.0 - 47.0 %   MCV 98.5 80.0 - 100.0 fL   MCH 34.1 (H) 26.0 - 34.0 pg   MCHC 34.6 32.0 - 36.0 g/dL   RDW 14.0 11.5 - 14.5 %   Platelets 333 150 - 440 K/uL   Neutrophils Relative % 58 %   Neutro Abs 8.2 (H) 1.4 - 6.5  K/uL   Lymphocytes Relative 32 %   Lymphs Abs 4.6 (H) 1.0 - 3.6 K/uL   Monocytes Relative 9 %   Monocytes Absolute 1.2 (H) 0.2 - 0.9 K/uL   Eosinophils Relative 0 %   Eosinophils Absolute 0.1 0 - 0.7 K/uL   Basophils Relative 1 %   Basophils Absolute 0.1 0 - 0.1 K/uL    Comment: Performed at [facility], [address] [place], [place] [address]  Comprehensive metabolic panel     Status: Abnormal   Collection Time: 02/06/18  6:20 PM  Result Value Ref Range   Sodium 139 135 - 145 mmol/L  Potassium 3.1 (L) 3.5 - 5.1 mmol/L   Chloride 108 98 - 111 mmol/L   CO2 19 (L) 22 - 32 mmol/L   Glucose, Bld 116 (H) 70 - 99 mg/dL   BUN <5 (L) 6 - 20 mg/dL   Creatinine, Ser 0.93 0.44 - 1.00 mg/dL   Calcium 9.4 8.9 - 10.3 mg/dL   Total Protein 7.9 6.5 - 8.1 g/dL   Albumin 4.2 3.5 - 5.0 g/dL   AST 26 15 - 41 U/L   ALT 14 0 - 44 U/L   Alkaline Phosphatase 48 38 - 126 U/L   Total Bilirubin 0.7 0.3 - 1.2 mg/dL   GFR calc non Af Amer >60 >60 mL/min   GFR calc Af Amer >60 >60 mL/min    Comment: (NOTE) The eGFR has been calculated using the CKD EPI equation. This calculation has not been validated in all clinical situations. eGFR's persistently <60 mL/min signify possible Chronic Kidney Disease.    Anion gap 12 5 - 15    Comment: Performed at [facility], [address] [place], [place] [address]  Ethanol     Status: Abnormal   Collection Time: 02/06/18  6:20 PM  Result Value Ref Range   Alcohol, Ethyl (B) 86 (H) <10 mg/dL    Comment: (NOTE) Lowest detectable limit for serum alcohol is 10 mg/dL. For medical purposes only. Performed at [facility], [address] [place], [place] [address]   Urinalysis, Complete w Microscopic     Status: Abnormal   Collection Time: 02/06/18  6:20 PM  Result Value Ref Range   Color, Urine STRAW (A) YELLOW   APPearance CLEAR (A) CLEAR   Specific Gravity, Urine 1.002 (L) 1.005 - 1.030   pH 6.0 5.0 - 8.0   Glucose, UA  NEGATIVE NEGATIVE mg/dL   Hgb urine dipstick SMALL (A) NEGATIVE   Bilirubin Urine NEGATIVE NEGATIVE   Ketones, ur NEGATIVE NEGATIVE mg/dL   Protein, ur NEGATIVE NEGATIVE mg/dL   Nitrite NEGATIVE NEGATIVE   Leukocytes, UA NEGATIVE NEGATIVE   RBC / HPF 0-5 0 - 5 RBC/hpf   WBC, UA 0-5 0 - 5 WBC/hpf   Bacteria, UA NONE SEEN NONE SEEN   Squamous Epithelial / LPF 0-5 0 - 5    Comment: Performed at [facility], [address] [place], [place] [address]  Urine Drug Screen, Qualitative ([facility] only)     Status: Abnormal   Collection Time: 02/06/18  6:20 PM  Result Value Ref Range   Tricyclic, Ur Screen NONE DETECTED NONE DETECTED   Amphetamines, Ur Screen NONE DETECTED NONE DETECTED   MDMA (Ecstasy)Ur Screen NONE DETECTED NONE DETECTED   Cocaine Metabolite,Ur [place] NONE DETECTED NONE DETECTED   Opiate, Ur Screen NONE DETECTED NONE DETECTED   Phencyclidine (PCP) Ur S NONE DETECTED NONE DETECTED   Cannabinoid 50 Ng, Ur [place] [address] POSITIVE (A) NONE DETECTED   Barbiturates, Ur Screen NONE DETECTED NONE DETECTED   Benzodiazepine, Ur Scrn NONE DETECTED NONE DETECTED   Methadone Scn, Ur NONE DETECTED NONE DETECTED    Comment: (NOTE) Tricyclics + metabolites, urine    Cutoff 1000 ng/mL Amphetamines + metabolites, urine  Cutoff 1000 ng/mL MDMA (Ecstasy), urine              Cutoff 500 ng/mL Cocaine Metabolite, urine          Cutoff 300 ng/mL Opiate + metabolites, urine        Cutoff 300 ng/mL Phencyclidine (PCP), urine  Cutoff 25 ng/mL Cannabinoid, urine                 Cutoff 50 ng/mL Barbiturates + metabolites, urine  Cutoff 200 ng/mL Benzodiazepine, urine              Cutoff 200 ng/mL Methadone, urine                   Cutoff 300 ng/mL The urine drug screen provides only a preliminary, unconfirmed analytical test result and should not be used for non-medical purposes. Clinical consideration and professional judgment should be applied to any positive drug screen result due to  possible interfering substances. A more specific alternate chemical method must be used in order to obtain a confirmed analytical result. Gas chromatography / mass spectrometry (GC/MS) is the preferred confirmat ory method. Performed at [facility], [address] [place], [place] [address]     Current Facility-Administered Medications  Medication Dose Route Frequency Provider Last Rate Last Dose  . haloperidol lactate (HALDOL) injection 5 mg  5 mg Intramuscular Q8H PRN Alfred Levins, [place], MD      . LORazepam (ATIVAN) tablet 1 mg  1 mg Oral QHS Alfred Levins, [place], MD   1 mg at 02/07/18 0159  . OLANZapine (ZYPREXA) tablet 10 mg  10 mg Oral QHS Alfred Levins, [place], MD   10 mg at 02/07/18 0200   Current Outpatient Medications  Medication Sig Dispense Refill  . diphenhydrAMINE (BENADRYL) 50 MG capsule Take 1 capsule (50 mg total) by mouth at bedtime. 30 capsule 2  . haloperidol (HALDOL) 5 MG tablet Take 1 tablet (5 mg total) by mouth at bedtime. 30 tablet 2  . haloperidol decanoate (HALDOL DECANOATE) 100 MG/ML injection Inject 0.5 mLs (50 mg total) into the muscle every 28 (twenty-eight) days. 1 mL 5    Musculoskeletal: Strength & Muscle Tone: within normal limits Gait & Station: normal Patient leans: N/A  Psychiatric Specialty Exam: Physical Exam  Nursing note and vitals reviewed. Constitutional: She appears well-developed and well-nourished.  HENT:  Head: Normocephalic and atraumatic.  Eyes: Pupils are equal, round, and reactive to light. Conjunctivae are normal.  Neck: Normal range of motion.  Cardiovascular: Regular rhythm and normal heart sounds.  Respiratory: Effort normal. No respiratory distress.  GI: Soft.  Musculoskeletal: Normal range of motion.  Neurological: She is alert.  Skin: Skin is warm and dry.  Psychiatric: Her speech is normal and behavior is normal. Judgment and thought content normal. Her mood appears anxious. Cognition and memory are normal.     Review of Systems  Constitutional: Negative.   HENT: Negative.   Eyes: Negative.   Respiratory: Negative.   Cardiovascular: Negative.   Gastrointestinal: Negative.   Musculoskeletal: Negative.   Skin: Negative.   Neurological: Negative.   Psychiatric/Behavioral: Positive for substance abuse. Negative for depression, hallucinations, memory loss and suicidal ideas. The patient is nervous/anxious and has insomnia.     Blood pressure (!) 136/96, pulse (!) 120, temperature 99 F (37.2 C), temperature source Oral, resp. rate 19, height 5' 2"  (1.575 m), weight 63.5 kg, SpO2 98 %.Body mass index is 25.61 kg/m.  General Appearance: Fairly Groomed  Eye Contact:  Fair  Speech:  Clear and Coherent  Volume:  Normal  Mood:  Euthymic  Affect:  Constricted  Thought Process:  Goal Directed  Orientation:  Full (Time, Place, and Person)  Thought Content:  Logical  Suicidal Thoughts:  No  Homicidal Thoughts:  No  Memory:  Immediate;   Fair  Recent;   Fair Remote;   Fair  Judgement:  Fair  Insight:  Fair  Psychomotor Activity:  Normal  Concentration:  Concentration: Fair  Recall:  [employer] of Knowledge:  Fair  Language:  Fair  Akathisia:  No  Handed:  Right  AIMS (if indicated):     Assets:  Desire for Improvement Housing Physical Health Social Support  ADL's:  Intact  Cognition:  WNL  Sleep:        Treatment Plan Summary: Medication management and Plan Patient interviewed chart reviewed.  Patient does not seem to be psychotic or nearly as decompensated as she was in the past.  She is currently lucid with good insight.  Not suicidal or homicidal.  Does not meet commitment criteria.  Discontinue the IV C.  Patient requests a prescription for Benadryl which had been an effective sleeping medicine for her in the past.  I am willing to give that to her.  She needs to follow-up with [facility] as soon as possible.  Education completed reminding her that she can come back to the emergency room  any time if symptoms continue or worsen.  Case reviewed with emergency room doctor and TTS.  Disposition: No evidence of imminent risk to self or others at present.   Patient does not meet criteria for psychiatric inpatient admission. Supportive therapy provided about ongoing stressors.  Alethia Berthold, MD 02/07/2018 1:59 PM

## 2018-02-07 NOTE — ED Notes (Signed)
Pt discharged to lobby to wait for her mother to arrive shortly  Pt denies SI, HI, and A/V hallucinations.  Pt verbalizes understanding of discharge instructions, follow-up care, and discharge instructions received.  Pt appears stable and in no acute distress.

## 2018-02-07 NOTE — ED Provider Notes (Signed)
 -----------------------------------------   3:57 PM on 02/07/2018 -----------------------------------------  Discussed with psychiatry Dr. Toni Amend he finds the patient psychiatrically stable for discharge home, continue outpatient follow-up with [facility].  Medically stable.   Sharman Cheek, MD 02/07/18 [phone]

## 2018-02-07 NOTE — ED Notes (Signed)
Hourly rounding reveals patient in rest room. No complaints, stable, in no acute distress. Q15 minute rounds and monitoring via Security Cameras to continue. 

## 2018-02-07 NOTE — ED Notes (Signed)
IVC  PAPERS  RESCINDED  PER  DR  CLAPACS  PENDING  D/C 

## 2018-02-07 NOTE — ED Provider Notes (Signed)
-----------------------------------------   6:40 AM on 02/07/2018 -----------------------------------------   Blood pressure (!) 136/96, pulse (!) 120, temperature 99 F (37.2 C), temperature source Oral, resp. rate 19, height 5\' 2"  (1.575 m), weight 63.5 kg, SpO2 98 %.  The patient had no acute events since last update.  Calm and cooperative at this time.  Disposition is pending Psychiatry/Behavioral Medicine team recommendations.     Irean Hong, MD 02/07/18 [phone]

## 2018-02-07 NOTE — Discharge Instructions (Signed)
Please follow-up with [facility] and continue taking your Benadryl for sleep.
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
## 2022-07-10 ENCOUNTER — Encounter: Payer: Self-pay | Admitting: Emergency Medicine

## 2022-07-10 ENCOUNTER — Emergency Department
Admission: EM | Admit: 2022-07-10 | Discharge: 2022-07-11 | Disposition: A | Discharge status: Home / Self Care | Payer: BC Managed Care – PPO | Attending: Emergency Medicine | Admitting: Emergency Medicine

## 2022-07-10 ENCOUNTER — Other Ambulatory Visit: Payer: Self-pay

## 2022-07-10 DIAGNOSIS — F314 Bipolar disorder, current episode depressed, severe, without psychotic features: Secondary | ICD-10-CM | POA: Diagnosis not present

## 2022-07-10 DIAGNOSIS — F32A Depression, unspecified: Secondary | ICD-10-CM

## 2022-07-10 DIAGNOSIS — F17210 Nicotine dependence, cigarettes, uncomplicated: Secondary | ICD-10-CM | POA: Insufficient documentation

## 2022-07-10 DIAGNOSIS — F319 Bipolar disorder, unspecified: Secondary | ICD-10-CM | POA: Insufficient documentation

## 2022-07-10 DIAGNOSIS — Z1152 Encounter for screening for COVID-19: Secondary | ICD-10-CM | POA: Diagnosis not present

## 2022-07-10 DIAGNOSIS — G4700 Insomnia, unspecified: Secondary | ICD-10-CM | POA: Diagnosis present

## 2022-07-10 LAB — COMPREHENSIVE METABOLIC PANEL
ALT: 17 U/L (ref 0–44)
AST: 19 U/L (ref 15–41)
Albumin: 4 g/dL (ref 3.5–5.0)
Alkaline Phosphatase: 47 U/L (ref 38–126)
Anion gap: 11 (ref 5–15)
BUN: 10 mg/dL (ref 6–20)
CO2: 24 mmol/L (ref 22–32)
Calcium: 9.1 mg/dL (ref 8.9–10.3)
Chloride: 100 mmol/L (ref 98–111)
Creatinine, Ser: 0.88 mg/dL (ref 0.44–1.00)
GFR, Estimated: >60 mL/min (ref >60)
Glucose, Bld: 97 mg/dL (ref 70–99)
Potassium: 3.1 mmol/L — ABNORMAL LOW (ref 3.5–5.1)
Sodium: 135 mmol/L (ref 135–145)
Total Bilirubin: 0.7 mg/dL (ref 0.3–1.2)
Total Protein: 7.9 g/dL (ref 6.5–8.1)

## 2022-07-10 LAB — CBC
HCT: 42 % (ref 36.0–46.0)
Hemoglobin: 13.6 g/dL (ref 12.0–15.0)
MCH: 31.9 pg (ref 26.0–34.0)
MCHC: 32.4 g/dL (ref 30.0–36.0)
MCV: 98.4 fL (ref 80.0–100.0)
Platelets: 367 10*3/uL (ref 150–400)
RBC: 4.27 MIL/uL (ref 3.87–5.11)
RDW: 13.2 % (ref 11.5–15.5)
WBC: 8.7 10*3/uL (ref 4.0–10.5)
nRBC: 0 % (ref 0.0–0.2)

## 2022-07-10 LAB — ACETAMINOPHEN LEVEL: Acetaminophen (Tylenol), Serum: <10 ug/mL — ABNORMAL LOW (ref 10–30)

## 2022-07-10 LAB — SALICYLATE LEVEL: Salicylate Lvl: <7 mg/dL — ABNORMAL LOW (ref 7.0–30.0)

## 2022-07-10 LAB — URINE DRUG SCREEN, QUALITATIVE (ARMC ONLY)
Amphetamines, Ur Screen: NOT DETECTED
Barbiturates, Ur Screen: NOT DETECTED
Benzodiazepine, Ur Scrn: NOT DETECTED
Cannabinoid 50 Ng, Ur ~~LOC~~: POSITIVE — AB
Cocaine Metabolite,Ur ~~LOC~~: NOT DETECTED
MDMA (Ecstasy)Ur Screen: NOT DETECTED
Methadone Scn, Ur: NOT DETECTED
Opiate, Ur Screen: NOT DETECTED
Phencyclidine (PCP) Ur S: NOT DETECTED
Tricyclic, Ur Screen: NOT DETECTED

## 2022-07-10 LAB — PREGNANCY, URINE: Preg Test, Ur: NEGATIVE

## 2022-07-10 LAB — ETHANOL: Alcohol, Ethyl (B): <10 mg/dL (ref <10)

## 2022-07-10 MED ORDER — DIVALPROEX SODIUM 500 MG PO DR TAB
500.0000 mg | DELAYED_RELEASE_TABLET | Freq: Two times a day (BID) | ORAL | Status: DC
  Administered 2022-07-10 – 2022-07-11 (×2): 500 mg via ORAL
  Filled 2022-07-10 (×2): qty 1

## 2022-07-10 MED ORDER — HALOPERIDOL 5 MG PO TABS
5.0000 mg | ORAL_TABLET | Freq: Every day | ORAL | Status: DC
  Administered 2022-07-10: 5 mg via ORAL
  Filled 2022-07-10: qty 1

## 2022-07-10 NOTE — ED Provider Notes (Signed)
[facility] Provider Note    Event Date/Time   First MD Initiated Contact with Patient 07/10/22 1545     (approximate)  History   Chief Complaint: Insomnia  HPI  Jody Eaton is a 42 y.o. female with a past medical history of bipolar, schizophrenia, presents to the emergency department for insomnia.  According to the sister who is here with the patient she states the patient has been acting more depressed recently, bringing up "things from the past" and is letting that effector.  States she has not been sleeping at night so she is tired often throughout the day.  States she lost her job this past Friday.  Here the patient keeps her eyes closed through most of the examination, she answers questions.  Patient denies any SI or HI.  Patient states she does not feel like she needs to be here, however she is willing to speak to a psychiatrist to appease her sister who is here with her.  Denies any medical complaints.  Does admit to alcohol use on a near daily basis.  Physical Exam   Triage Vital Signs: ED Triage Vitals  Enc Vitals Group     BP 07/10/22 1500 (!) 160/108     Pulse Rate 07/10/22 1500 (!) 109     Resp 07/10/22 1500 20     Temp 07/10/22 1500 97.7 F (36.5 C)     Temp Source 07/10/22 1500 Oral     SpO2 07/10/22 1500 100 %     Weight --      Height 07/10/22 1451 '5\' 1"'$  (1.549 m)     Head Circumference --      Peak Flow --      Pain Score 07/10/22 1451 0     Pain Loc --      Pain Edu? --      Excl. in [place]? --     Most recent vital signs: Vitals:   07/10/22 1500  BP: (!) 160/108  Pulse: (!) 109  Resp: 20  Temp: 97.7 F (36.5 C)  SpO2: 100%    General: Awake, no distress.  CV:  Good peripheral perfusion.  Regular rate and rhythm  Resp:  Normal effort.  Equal breath sounds bilaterally.  Abd:  No distention.  Soft, nontender.  No rebound or guarding.  ED Results / Procedures / Treatments   MEDICATIONS ORDERED IN ED: Medications - No  data to display   IMPRESSION / MDM / [place] / ED COURSE  I reviewed the triage vital signs and the nursing notes.  Patient's presentation is most consistent with acute presentation with potential threat to life or bodily function.  Patient presents emergency department for insomnia and possible depression.  Patient denies any SI or HI denies any medical complaints.  Does not appear to meet IVC criteria however she is willing to stay to speak to psychiatry at her sister's request.  Patient does admit to alcohol use.  Patient's labs today are overall reassuring for normal CBC, negative acetaminophen salicylate and ethanol level normal chemistry.  Patient has been seen by psychiatry.  They have started the patient on medications and will reassess in the morning.  Will move the patient into a room to see if she is able to get some sleep.  Patient agreeable. FINAL CLINICAL IMPRESSION(S) / ED DIAGNOSES   Insomnia   Note:  This document was prepared using Dragon voice recognition software and may include unintentional dictation errors.  Harvest Dark, MD 07/10/22 [phone]

## 2022-07-10 NOTE — ED Notes (Signed)
Pt sleeping, will obtain vitals when pt wakes up.

## 2022-07-10 NOTE — ED Triage Notes (Addendum)
Pt via POV from home. Pt states she recently lost her job and started a new job causing her to start a new job that is interfering with her sleep. Per sister, pt's triggers are when she has a lack of sleep. Pt has a hx of bipolar disorder but is not on any daily medication for it. Pt denies SI/HI. Denies AVH. Pt is very withdrawn but calm and cooperative during triage.

## 2022-07-10 NOTE — ED Notes (Signed)
Patient belongs:  Black pants Black and white shirt Sliver stud earrings Black socks Gray underwear Bra  Red jacket Orange and red charm Black and white cheetah print on the back shoes.

## 2022-07-10 NOTE — ED Notes (Signed)
Patient is calm and collective. No behavorial issues at this time.

## 2022-07-10 NOTE — ED Notes (Signed)
This tech obtained vital signs and gave pt a snack and drink.

## 2022-07-10 NOTE — Consult Note (Signed)
[facility] Face-to-Face Psychiatry Consult   Reason for Consult:  mania, no sleep Referring Physician:  EDP Patient Identification: Jody Eaton MRN:  [id number] Principal Diagnosis: Bipolar I disorder ([place]) Diagnosis:  Principal Problem:   Bipolar I disorder ([place])   Total Time spent with patient: 45 minutes  Subjective:   Jody Eaton is a 42 y.o. female patient admitted with "I don't want to be here".  HPI:  42 yo female presents with concerns from her family as Jody Eaton recently started a new job after getting fired from a previous job with lack of sleep.  On assessment, Jody Eaton is slow to respond, quiet, and reports wanting not to be here.  Jody Eaton stated Jody Eaton has a plan and intent but will not reveal what it is.  Past history of psychosis, none currently.  Sleep is poor, appetite is fair.  Jody Eaton was on haldol in the past and stabilized, agreeable to restart.  Admit to inpatient for stabilization.  Past Psychiatric History: bipolar d/o  Risk to Self:  yes Risk to Others:  none Prior Inpatient Therapy:  a few Prior Outpatient Therapy:  none currently  Past Medical History:  Past Medical History:  Diagnosis Date   Bipolar 1 disorder ([place])    Schizophrenia ([place])    History reviewed. No pertinent surgical history. Family History: History reviewed. No pertinent family history. Family Psychiatric  History: none Social History:  Social History   Substance and Sexual Activity  Alcohol Use No     Social History   Substance and Sexual Activity  Drug Use No    Social History   Socioeconomic History   Marital status: Single    Spouse name: Not on file   Number of children: Not on file   Years of education: Not on file   Highest education level: Not on file  Occupational History   Not on file  Tobacco Use   Smoking status: Light Smoker    Packs/day: 0.00    Years: 1.00    Total pack years: 0.00    Types: Cigarettes   Smokeless tobacco: Never  Substance and Sexual Activity    Alcohol use: No   Drug use: No   Sexual activity: Yes    Birth control/protection: Condom  Other Topics Concern   Not on file  Social History Narrative   Not on file   Social Determinants of Health   Financial Resource Strain: Not on file  Food Insecurity: Not on file  Transportation Needs: Not on file  Physical Activity: Not on file  Stress: Not on file  Social Connections: Not on file   Additional Social History:  supportive family    Allergies:  No Known Allergies  Labs:  Results for orders placed or performed during the hospital encounter of 07/10/22 (from the past 48 hour(s))  Comprehensive metabolic panel     Status: Abnormal   Collection Time: 07/10/22  3:02 PM  Result Value Ref Range   Sodium 135 135 - 145 mmol/L   Potassium 3.1 (L) 3.5 - 5.1 mmol/L   Chloride 100 98 - 111 mmol/L   CO2 24 22 - 32 mmol/L   Glucose, Bld 97 70 - 99 mg/dL    Comment: Glucose reference range applies only to samples taken after fasting for at least 8 hours.   BUN 10 6 - 20 mg/dL   Creatinine, Ser 0.88 0.44 - 1.00 mg/dL   Calcium 9.1 8.9 - 10.3 mg/dL   Total Protein 7.9 6.5 -  8.1 g/dL   Albumin 4.0 3.5 - 5.0 g/dL   AST 19 15 - 41 U/L   ALT 17 0 - 44 U/L   Alkaline Phosphatase 47 38 - 126 U/L   Total Bilirubin 0.7 0.3 - 1.2 mg/dL   GFR, Estimated >60 >60 mL/min    Comment: (NOTE) Calculated using the CKD-EPI Creatinine Equation (2021)    Anion gap 11 5 - 15    Comment: Performed at [facility], [address] [place], [place] [address]  Ethanol     Status: None   Collection Time: 07/10/22  3:02 PM  Result Value Ref Range   Alcohol, Ethyl (B) <10 <10 mg/dL    Comment: (NOTE) Lowest detectable limit for serum alcohol is 10 mg/dL.  For medical purposes only. Performed at [facility], [address] [place], [place] [id number]   Salicylate level     Status: Abnormal   Collection Time: 07/10/22  3:02 PM  Result Value Ref Range   Salicylate Lvl [id number] (L)  7.0 - 30.0 mg/dL    Comment: Performed at [facility], [address] [place], [place] [address]  Acetaminophen level     Status: Abnormal   Collection Time: 07/10/22  3:02 PM  Result Value Ref Range   Acetaminophen (Tylenol), Serum <10 (L) 10 - 30 ug/mL    Comment: (NOTE) Therapeutic concentrations vary significantly. A range of 10-30 ug/mL  may be an effective concentration for many patients. However, some  are best treated at concentrations outside of this range. Acetaminophen concentrations >150 ug/mL at 4 hours after ingestion  and >50 ug/mL at 12 hours after ingestion are often associated with  toxic reactions.  Performed at [facility], [address] [place] , [place] [address]   cbc     Status: None   Collection Time: 07/10/22  3:02 PM  Result Value Ref Range   WBC 8.7 4.0 - 10.5 K/uL   RBC 4.27 3.87 - 5.11 MIL/uL   Hemoglobin 13.6 12.0 - 15.0 g/dL   HCT 42.0 36.0 - 46.0 %   MCV 98.4 80.0 - 100.0 fL   MCH 31.9 26.0 - 34.0 pg   MCHC 32.4 30.0 - 36.0 g/dL   RDW 13.2 11.5 - 15.5 %   Platelets 367 150 - 400 K/uL   nRBC 0.0 0.0 - 0.2 %    Comment: Performed at [facility], [address] [place], [place] [address]    No current facility-administered medications for this encounter.   Current Outpatient Medications  Medication Sig Dispense Refill   diphenhydrAMINE (BENADRYL) 50 MG capsule Take 1 capsule (50 mg total) by mouth at bedtime. 30 capsule 2   haloperidol (HALDOL) 5 MG tablet Take 1 tablet (5 mg total) by mouth at bedtime. 30 tablet 2   haloperidol decanoate (HALDOL DECANOATE) 100 MG/ML injection Inject 0.5 mLs (50 mg total) into the muscle every 28 (twenty-eight) days. 1 mL 5    Musculoskeletal: Strength & Muscle Tone: within normal limits Gait & Station: normal Patient leans: N/A  Psychiatric Specialty Exam: Physical Exam Vitals and nursing note reviewed.  Constitutional:      Appearance: Normal appearance.  HENT:      Head: Normocephalic.     Nose: Nose normal.  Pulmonary:     Effort: Pulmonary effort is normal.  Musculoskeletal:        General: Normal range of motion.     Cervical back: Normal range of motion.  Neurological:  General: No focal deficit present.     Mental Status: Jody Eaton is alert and oriented to person, place, and time.  Psychiatric:        Attention and Perception: Attention and perception normal.        Mood and Affect: Mood is anxious and depressed.        Speech: Speech normal.        Behavior: Behavior normal. Behavior is cooperative.        Thought Content: Thought content includes suicidal ideation. Thought content includes suicidal plan.        Cognition and Memory: Cognition and memory normal.        Judgment: Judgment normal.     Review of Systems  Psychiatric/Behavioral:  Positive for depression and suicidal ideas. The patient is nervous/anxious.   All other systems reviewed and are negative.   Blood pressure (!) 160/108, pulse (!) 109, temperature 97.7 F (36.5 C), temperature source Oral, resp. rate 20, height '5\' 1"'$  (1.549 m), last menstrual period 07/09/2022, SpO2 100 %.Body mass index is 26.45 kg/m.  General Appearance: Casual  Eye Contact:  Fair  Speech:  Slow  Volume:  Decreased  Mood:  Anxious and Depressed  Affect:  Congruent  Thought Process:  Coherent  Orientation:  Full (Time, Place, and Person)  Thought Content:  Rumination  Suicidal Thoughts:  Yes with intent and a plan  Homicidal Thoughts:  No  Memory:  Immediate;   Fair Recent;   Fair Remote;   Fair  Judgement:  Poor  Insight:  Fair  Psychomotor Activity:  Decreased  Concentration:  Concentration: Fair and Attention Span: Fair  Recall:  [employer] of Knowledge:  Good  Language:  Fair  Akathisia:  No  Handed:  Right  AIMS (if indicated):     Assets:  Housing Leisure Time Physical Health Resilience Social Support Vocational/Educational  ADL's:  Intact  Cognition:  WNL  Sleep:         Physical Exam: Physical Exam Vitals and nursing note reviewed.  Constitutional:      Appearance: Normal appearance.  HENT:     Head: Normocephalic.     Nose: Nose normal.  Pulmonary:     Effort: Pulmonary effort is normal.  Musculoskeletal:        General: Normal range of motion.     Cervical back: Normal range of motion.  Neurological:     General: No focal deficit present.     Mental Status: Jody Eaton is alert and oriented to person, place, and time.  Psychiatric:        Attention and Perception: Attention and perception normal.        Mood and Affect: Mood is anxious and depressed.        Speech: Speech normal.        Behavior: Behavior normal. Behavior is cooperative.        Thought Content: Thought content includes suicidal ideation. Thought content includes suicidal plan.        Cognition and Memory: Cognition and memory normal.        Judgment: Judgment normal.    Review of Systems  Psychiatric/Behavioral:  Positive for depression and suicidal ideas. The patient is nervous/anxious.   All other systems reviewed and are negative.  Blood pressure (!) 160/108, pulse (!) 109, temperature 97.7 F (36.5 C), temperature source Oral, resp. rate 20, height '5\' 1"'$  (1.549 m), last menstrual period 07/09/2022, SpO2 100 %. Body mass index is 26.45 kg/m.  Treatment  Plan Summary: Daily contact with patient to assess and evaluate symptoms and progress in treatment, Medication management, and Plan : Bipolar affective disorder, depressed, severe: Haldol 5 mg at bedtime Depakote 500 mg BID  Disposition: Psych admit  Waylan Boga, NP 07/10/2022 5:12 PM

## 2022-07-10 NOTE — BH Assessment (Signed)
Comprehensive Clinical Assessment (CCA) Note  07/10/2022 Jody Eaton [id number]  Chief Complaint:  Chief Complaint  Patient presents with   Insomnia   Visit Diagnosis: Bipolar I Disorder   Jody Eaton is a 42 year old female who presents to the ER, due to her family having concerns about her lack of sleep. Her new job work schedule has been the cause of her not being able to sleep. In the past, when the patient hasn't slept, her symptoms of her Bipolar increased and she's unable to manage them. While in the ER the patient shared she's having thoughts of ending her life, and had a plan. She also shared she hasn't been taking her medications.  During the interview, the patient was cooperative and pleasant. She had a flat affect and when answering questions, she was matter affect. Patient denies HI and AV/H.  CCA Screening, Triage and Referral (STR)  Patient Reported Information How did you hear about [place]? Family/Friend  What Is the Reason for Your Visit/Call Today? Family have concerns of her not sleeping and her history of Bipolar, symptoms worsening because of the lack of sleep.  How Long Has This Been Causing You Problems? <Week  What Do You Feel Would Help You the Most Today? Treatment for Depression or other mood problem   Have You Recently Had Any Thoughts About Hurting Yourself? Yes  Are You Planning to Commit Suicide/Harm Yourself At This time? Yes   [place] ED from 07/10/2022 in [facility] Emergency Department at [place] Error: Q2 is Yes, you must answer 3, 4, and 5       Have you Recently Had Thoughts About [place]? No  Are You Planning to Harm Someone at This Time? No  Explanation: No data recorded  Have You Used Any Alcohol or Drugs in the Past 24 Hours? No  What Did You Use and How Much? No data recorded  Do You Currently Have a Therapist/Psychiatrist? Yes  Name of Therapist/Psychiatrist:     Have You Been Recently Discharged From Any Office Practice or Programs? No  Explanation of Discharge From Practice/Program: No data recorded    CCA Screening Triage Referral Assessment Type of Contact: Face-to-Face  Telemedicine Service Delivery:   Is this Initial or Reassessment?   Date Telepsych consult ordered in [facility]:    Time Telepsych consult ordered in [facility]:    Location of Assessment: [facility] ED  Provider Location: [facility] ED   Collateral Involvement: No data recorded  Does Patient Have a [place]? No data recorded Legal Guardian Contact Information: No data recorded Copy of Legal Guardianship Form: No data recorded Legal Guardian Notified of Arrival: No data recorded Legal Guardian Notified of Pending Discharge: No data recorded If Minor and Not Living with Parent(s), Who has Custody? No data recorded Is CPS involved or ever been involved? Never  Is APS involved or ever been involved? Never   Patient Determined To Be At Risk for Harm To Self or Others Based on Review of Patient Reported Information or Presenting Complaint? No data recorded Method: No data recorded Availability of Means: No data recorded Intent: No data recorded Notification Required: No data recorded Additional Information for Danger to Others Potential: No data recorded Additional Comments for Danger to Others Potential: No data recorded Are There Guns or Other Weapons in Your Home? No data recorded Types of Guns/Weapons: No data recorded Are These Weapons Safely Secured?  No data recorded Who Could Verify You Are Able To Have These Secured: No data recorded Do You Have any Outstanding Charges, Pending Court Dates, Parole/Probation? No data recorded Contacted To Inform of Risk of Harm To Self or Others: No data recorded   Does Patient Present under Involuntary Commitment? No   [place] of Residence: [place]   Patient Currently Receiving the  Following Services: Medication Management   Determination of Need: Emergent (2 hours)   Options For Referral: Inpatient Hospitalization; ED Visit   CCA Biopsychosocial Patient Reported Schizophrenia/Schizoaffective Diagnosis in Past: No   Strengths: Have insight, have support system, stable housing and employed.   Mental Health Symptoms Depression:   Change in energy/activity; Sleep (too much or little)   Duration of Depressive symptoms:  Duration of Depressive Symptoms: Greater than two weeks   Mania:   None   Anxiety:    N/A   Psychosis:   None   Duration of Psychotic symptoms:    Trauma:   N/A   Obsessions:   N/A   Compulsions:   N/A   Inattention:   N/A   Hyperactivity/Impulsivity:   N/A   Oppositional/Defiant Behaviors:   N/A   Emotional Irregularity:   N/A   Other Mood/Personality Symptoms:  No data recorded   Mental Status Exam Appearance and self-care  Stature:   Average   Weight:   Average weight   Clothing:   Neat/clean; Age-appropriate   Grooming:   Neglected   Cosmetic use:   None   Posture/gait:   Normal   Motor activity:   -- (Within normal range)   Sensorium  Attention:   Normal   Concentration:   Scattered   Orientation:   X5   Recall/memory:   Normal   Affect and Mood  Affect:   Blunted; Flat   Mood:   Depressed   Relating  Eye contact:   Normal   Facial expression:   Constricted; Sad   Attitude toward examiner:   Cooperative   Thought and Language  Speech flow:  Slow; Soft   Thought content:   Appropriate to Mood and Circumstances   Preoccupation:  No data recorded  Hallucinations:   None   Organization:   Logical   Transport planner of Knowledge:   Average   Intelligence:   Average   Abstraction:   Functional   Judgement:   Impaired   Reality Testing:   Adequate   Insight:   Fair   Decision Making:   Impulsive   Social Functioning  Social  Maturity:   Isolates   Social Judgement:   Normal; "Games developer"   Stress  Stressors:   Work   Coping Ability:   Normal   Skill Deficits:   None   Supports:   Family     Religion: Religion/Spirituality Are You A Religious Person?: No  Leisure/Recreation: Leisure / Recreation Do You Have Hobbies?: No  Exercise/Diet: Exercise/Diet Do You Exercise?: No Have You Gained or Lost A Significant Amount of Weight in the Past Six Months?: No Do You Follow a Special Diet?: No Do You Have Any Trouble Sleeping?: Yes Explanation of Sleeping Difficulties: Reports of having no sleep. States she doesn't know how long it has been.   CCA Employment/Education Employment/Work Situation: Employment / Work Situation Employment Situation: Employed Work Stressors: Work schedule has interfered with her sleep. Patient's Job has Been Impacted by Current Illness: Yes Describe how Patient's Job has Been Impacted: Work schedule has interfered with  her sleep. Has Patient ever Been in the [place]?: No  Education: Education Is Patient Currently Attending School?: No Did You Have An Individualized Education Program (IIEP): No Did You Have Any Difficulty At School?: No Patient's Education Has Been Impacted by Current Illness: No   CCA Family/Childhood History Family and Relationship History: Family history Marital status: Single  Childhood History:  Childhood History By whom was/is the patient raised?: Both parents Did patient suffer any verbal/emotional/physical/sexual abuse as a child?: No Did patient suffer from severe childhood neglect?: No Has patient ever been sexually abused/assaulted/raped as an adolescent or adult?: No Was the patient ever a victim of a crime or a disaster?: No Witnessed domestic violence?: No Has patient been affected by domestic violence as an adult?: No   CCA Substance Use Alcohol/Drug Use:  ASAM's:  Six Dimensions of Multidimensional  Assessment  Dimension 1:  Acute Intoxication and/or Withdrawal Potential:      Dimension 2:  Biomedical Conditions and Complications:      Dimension 3:  Emotional, Behavioral, or Cognitive Conditions and Complications:     Dimension 4:  Readiness to Change:     Dimension 5:  Relapse, Continued use, or Continued Problem Potential:     Dimension 6:  Recovery/Living Environment:     ASAM Severity Score:    ASAM Recommended Level of Treatment:     Substance use Disorder (SUD)    Recommendations for Services/Supports/Treatments:    Discharge Disposition:    DSM5 Diagnoses: Patient Active Problem List   Diagnosis Date Noted   Bipolar 1 disorder, depressed, severe ([place]) 07/10/2022   Noncompliance 07/03/2017   Cannabis abuse 07/03/2017   Tobacco use disorder 05/02/2016   Bipolar I disorder ([place]) 05/02/2016    Referrals to Alternative Service(s): Referred to Alternative Service(s):   Place:   Date:   Time:    Referred to Alternative Service(s):   Place:   Date:   Time:    Referred to Alternative Service(s):   Place:   Date:   Time:    Referred to Alternative Service(s):   Place:   Date:   Time:     Gunnar Fusi MS, LCAS, [facility], [facility] Therapeutic Triage Specialist 07/10/2022 6:12 PM

## 2022-07-10 NOTE — ED Notes (Signed)
MD Paduchowski at bedside for assessment

## 2022-07-10 NOTE — ED Notes (Signed)
VOL psych admit per Waylan Boga NP

## 2022-07-10 NOTE — ED Notes (Signed)
Pt currently sleeping. Pt's sister remain at bedside.

## 2022-07-11 ENCOUNTER — Encounter: Payer: Self-pay | Admitting: Psychiatry

## 2022-07-11 ENCOUNTER — Other Ambulatory Visit: Payer: Self-pay

## 2022-07-11 ENCOUNTER — Inpatient Hospital Stay
Admission: AD | Admit: 2022-07-11 | Discharge: 2022-07-13 | DRG: 885 | Disposition: A | Discharge status: Home / Self Care | Payer: 59 | Source: Intra-hospital | Attending: Psychiatry | Admitting: Psychiatry

## 2022-07-11 DIAGNOSIS — Z20822 Contact with and (suspected) exposure to covid-19: Secondary | ICD-10-CM | POA: Diagnosis present

## 2022-07-11 DIAGNOSIS — F203 Undifferentiated schizophrenia: Secondary | ICD-10-CM | POA: Insufficient documentation

## 2022-07-11 DIAGNOSIS — R45851 Suicidal ideations: Secondary | ICD-10-CM | POA: Diagnosis present

## 2022-07-11 DIAGNOSIS — G4700 Insomnia, unspecified: Secondary | ICD-10-CM | POA: Diagnosis present

## 2022-07-11 DIAGNOSIS — F314 Bipolar disorder, current episode depressed, severe, without psychotic features: Principal | ICD-10-CM | POA: Diagnosis present

## 2022-07-11 DIAGNOSIS — F17210 Nicotine dependence, cigarettes, uncomplicated: Secondary | ICD-10-CM | POA: Diagnosis present

## 2022-07-11 LAB — RESP PANEL BY RT-PCR (RSV, FLU A&B, COVID)  RVPGX2
Influenza A by PCR: NEGATIVE
Influenza B by PCR: NEGATIVE
Resp Syncytial Virus by PCR: NEGATIVE
SARS Coronavirus 2 by RT PCR: NEGATIVE

## 2022-07-11 MED ORDER — ALUM & MAG HYDROXIDE-SIMETH 200-200-20 MG/5ML PO SUSP: 30.0000 mL | ORAL | Status: DC | PRN

## 2022-07-11 MED ORDER — ACETAMINOPHEN 325 MG PO TABS: 650.0000 mg | ORAL_TABLET | Freq: Four times a day (QID) | ORAL | Status: DC | PRN

## 2022-07-11 MED ORDER — HALOPERIDOL LACTATE 5 MG/ML IJ SOLN: 5.0000 mg | Freq: Two times a day (BID) | INTRAMUSCULAR | Status: AC | PRN

## 2022-07-11 MED ORDER — LORAZEPAM 2 MG PO TABS: 2.0000 mg | ORAL_TABLET | Freq: Two times a day (BID) | ORAL | Status: AC | PRN

## 2022-07-11 MED ORDER — DIPHENHYDRAMINE HCL 25 MG PO CAPS: 50.0000 mg | ORAL_CAPSULE | Freq: Two times a day (BID) | ORAL | Status: AC | PRN

## 2022-07-11 MED ORDER — DIPHENHYDRAMINE HCL 50 MG/ML IJ SOLN: 50.0000 mg | Freq: Two times a day (BID) | INTRAMUSCULAR | Status: AC | PRN

## 2022-07-11 MED ORDER — HALOPERIDOL 5 MG PO TABS: 5.0000 mg | ORAL_TABLET | Freq: Two times a day (BID) | ORAL | Status: AC | PRN

## 2022-07-11 MED ORDER — POTASSIUM CHLORIDE CRYS ER 20 MEQ PO TBCR
40.0000 meq | EXTENDED_RELEASE_TABLET | Freq: Once | ORAL | Status: AC
  Administered 2022-07-11: 40 meq via ORAL
  Filled 2022-07-11: qty 2

## 2022-07-11 MED ORDER — MAGNESIUM HYDROXIDE 400 MG/5ML PO SUSP: 30.0000 mL | Freq: Every day | ORAL | Status: DC | PRN

## 2022-07-11 MED ORDER — LORAZEPAM 2 MG/ML IJ SOLN: 2.0000 mg | Freq: Two times a day (BID) | INTRAMUSCULAR | Status: AC | PRN

## 2022-07-11 MED ORDER — HALOPERIDOL 5 MG PO TABS
5.0000 mg | ORAL_TABLET | Freq: Every day | ORAL | Status: DC
  Administered 2022-07-11 – 2022-07-12 (×2): 5 mg via ORAL
  Filled 2022-07-11 (×2): qty 1

## 2022-07-11 MED ORDER — NICOTINE 14 MG/24HR TD PT24
14.0000 mg | MEDICATED_PATCH | Freq: Every day | TRANSDERMAL | Status: DC
  Administered 2022-07-11: 14 mg via TRANSDERMAL
  Filled 2022-07-11 (×2): qty 1

## 2022-07-11 MED ORDER — DIVALPROEX SODIUM 500 MG PO DR TAB
500.0000 mg | DELAYED_RELEASE_TABLET | Freq: Two times a day (BID) | ORAL | Status: DC
  Administered 2022-07-11 – 2022-07-12 (×2): 500 mg via ORAL
  Filled 2022-07-11 (×2): qty 1

## 2022-07-11 NOTE — Progress Notes (Signed)
Patient presents voluntarily from the ED. She states that her sister brought her here. Her main concern is that she has not slept in days due to stress getting out of a toxic relationship and losing her job, but says "I'm all good now". Patient denies SI, HI, AVH, anxiety, and depression. She says her goal is to get out of the hospital as soon as possible so she can find a new job and pay her rent. Patient answers assessment questions appropriately and interacts appropriately with staff and other patients on the unit. She reports that she has had a poor appetite and does not remember the last time she had decent meals before coming to the hospital.   Patient is oriented to the unit and unit policies. She verbalizes understanding. Patient came to the dayroom for dinner and is seen using the phones on the unit. Patient remains safe on the unit at this time/

## 2022-07-11 NOTE — Tx Team (Signed)
Initial Treatment Plan 07/11/2022 4:43 PM [name] [id number]   PATIENT STRESSORS: Occupational concerns   Other: relationship issues     PATIENT STRENGTHS: Capable of independent living  Marketing executive fund of knowledge  Physical Health    PATIENT IDENTIFIED PROBLEMS: Loss of job  Lack of sleep  Getting out of a toxic relationship                 DISCHARGE CRITERIA:  Improved stabilization in mood, thinking, and/or behavior Need for constant or close observation no longer present Safe-care adequate arrangements made  PRELIMINARY DISCHARGE PLAN: Return to previous living arrangement  PATIENT/FAMILY INVOLVEMENT: This treatment plan has been presented to and reviewed with the patient, Jody Eaton.  The patient has been given the opportunity to ask questions and make suggestions.  Sunday Spillers, RN 07/11/2022, 4:43 PM

## 2022-07-11 NOTE — ED Provider Notes (Signed)
Emergency Medicine Observation Re-evaluation Note  Jody Eaton is a 42 y.o. female, seen on rounds today.  Pt initially presented to the ED for complaints of Insomnia  Currently, the patient is is no acute distress. Denies any concerns at this time.  Physical Exam  Blood pressure 134/86, pulse (!) 106, temperature 98.7 F (37.1 C), temperature source Oral, resp. rate 20, height '5\' 1"'$  (1.549 m), last menstrual period 07/09/2022, SpO2 97 %.  Physical Exam: General: No apparent distress Pulm: Normal WOB Neuro: Moving all extremities Psych: Resting comfortably     ED Course / MDM     I have reviewed the labs performed to date as well as medications administered while in observation.  Recent changes in the last 24 hours include: No acute events overnight.  Plan   Current plan: Patient awaiting psychiatric disposition. Patient is under full IVC at this time.    Stacy Sailer, Delice Bison, DO 07/11/22 [phone]

## 2022-07-11 NOTE — ED Notes (Signed)
NT taking pt down to behavioral floor with escort

## 2022-07-11 NOTE — ED Notes (Signed)
Lunch tray given. 

## 2022-07-11 NOTE — BH Assessment (Signed)
Patient is to be admitted to [facility] BMU today 07/11/22 by Dr. Weber Cooks.  Attending Physician will be Dr.  Weber Cooks .   Patient has been assigned to room 307, by [place].    ER staff is aware of the admission: Lattie Haw, ER Secretary   Dr. Charna Archer, ER MD  Lanelle Bal, Patient's Nurse

## 2022-07-12 ENCOUNTER — Other Ambulatory Visit: Payer: Self-pay

## 2022-07-12 DIAGNOSIS — F314 Bipolar disorder, current episode depressed, severe, without psychotic features: Secondary | ICD-10-CM | POA: Diagnosis not present

## 2022-07-12 MED ORDER — HALOPERIDOL 5 MG PO TABS: 5.0000 mg | ORAL_TABLET | Freq: Every day | ORAL | 1 refills | Status: DC

## 2022-07-12 MED ORDER — NICOTINE 14 MG/24HR TD PT24: 14.0000 mg | MEDICATED_PATCH | Freq: Every day | TRANSDERMAL | 1 refills | Status: DC

## 2022-07-12 MED ORDER — ENSURE ENLIVE PO LIQD
237.0000 mL | Freq: Two times a day (BID) | ORAL | Status: DC
  Administered 2022-07-12 – 2022-07-13 (×3): 237 mL via ORAL

## 2022-07-12 MED ORDER — ADULT MULTIVITAMIN W/MINERALS CH
1.0000 | ORAL_TABLET | Freq: Every day | ORAL | Status: DC
  Administered 2022-07-12 – 2022-07-13 (×2): 1 via ORAL
  Filled 2022-07-12: qty 1

## 2022-07-12 MED ORDER — NICOTINE 14 MG/24HR TD PT24
14.0000 mg | MEDICATED_PATCH | Freq: Every day | TRANSDERMAL | 0 refills | Status: DC
  Filled 2022-07-12: qty 14, 14d supply, fill #0

## 2022-07-12 MED ORDER — HALOPERIDOL 5 MG PO TABS
5.0000 mg | ORAL_TABLET | Freq: Every day | ORAL | 0 refills | Status: DC
  Filled 2022-07-12: qty 10, 10d supply, fill #0

## 2022-07-12 NOTE — Progress Notes (Signed)
NUTRITION ASSESSMENT  Pt identified as at risk on the Malnutrition Screen Tool  INTERVENTION:  -Continue with regular diet -MVI with minerals daily -Ensure Enlive po BID, each supplement provides 350 kcal and 20 grams of protein.   NUTRITION DIAGNOSIS: Unintentional weight loss related to sub-optimal intake as evidenced by pt report.   Goal: Pt to meet >/= 90% of their estimated nutrition needs.  Monitor:  PO intake  Assessment:  42 yo female presents with concerns from her family as she recently started a new job after getting fired from a previous job with lack of sleep.     Pt admitted with bipolar disorder.   Per H&P, pt with poor sleeping patterns and poor appetite PTA. Pt has social stressors secondary to a recent break-up and losing her job.   Pt currently on a regular diet. No meal completion data available to assess at this time.   Reviewed wt hx; pt with distant weight loss. Pt would benefit from addition of oral nutrition supplements.   Medications reviewed and include depakote.  Labs reviewed: K: 3.1.    42 y.o. female  Height: Ht Readings from Last 1 Encounters:  07/11/22 '5\' 1"'$  (1.549 m)    Weight: Wt Readings from Last 1 Encounters:  07/11/22 57.6 kg    Weight Hx: Wt Readings from Last 10 Encounters:  07/11/22 57.6 kg  02/06/18 63.5 kg  07/02/17 59 kg  05/01/16 60.8 kg  05/01/16 63.5 kg    BMI:  Body mass index is 24 kg/m. BMI WDL.   Estimated Nutritional Needs: Kcal: 25-30 kcal/kg Protein: > 1 gram protein/kg Fluid: 1 ml/kcal  Diet Order:  Diet Order             Diet regular Room service appropriate? Yes; Fluid consistency: Thin  Diet effective now                  Pt is also offered choice of unit snacks mid-morning and mid-afternoon.  Pt is eating as desired.   Lab results and medications reviewed.   Loistine Chance, RD, LDN, [place] Registered Dietitian II Certified Diabetes Care and Education Specialist Please refer to  [facility] for RD and/or RD on-call/weekend/after hours pager

## 2022-07-12 NOTE — [facility] Group Notes (Signed)
[facility] Group Notes:  (Nursing/MHT/Case Management/Adjunct)  Date:  07/12/2022  Time:  9:50 AM  Type of Therapy:   community meeting  Participation Level:  Active  Participation Quality:  Appropriate  Affect:  Appropriate  Cognitive:  Appropriate  Insight:  Appropriate  Engagement in Group:  Engaged  Modes of Intervention:  Activity, Discussion, and Education  Summary of Progress/Problems:  Antonieta Pert 07/12/2022, 9:50 AM

## 2022-07-12 NOTE — Progress Notes (Signed)
Patient calm and pleasant during assessment denying SI/HI/AVH. Patient stated she was hopeful she would be able to discharge soon. Pt given education. Pt observed interacting appropriately with staff and peers on the unit by this Probation officer. Pt compliant with medication administration per MD orders. Pt given education, support, and encouragement to be active in her treatment plan. Pt being monitored Q 15 minutes for safety per unit protocol, remains safe on the unit

## 2022-07-12 NOTE — Progress Notes (Signed)
D- Patient alert and oriented x4. Affect bright/mood euthymic. Denies SI/HI/ AVH. She denies pain. Her goal today is to "do whatever I need to go home". She denies depression or anxiety. She states she needs "rest" due to unstable home environment. A- Scheduled medications administered to patient, per MD orders. Began Ensure supplement and MVI today. She states that her appetite is "good; too  much food" Support and encouragement provided.  Routine safety checks conducted every 15 minutes with out incident. Patient informed to notify staff with problems or concerns and verbalizes understanding. R- No adverse drug reactions noted. Depakote discontinued per MD. Haldol to begin tonight @ HS. Patient compliant with medications and treatment plan. Patient receptive, calm, and cooperative. She spends a lot of her time in her room but interacts well with others on the unit. Patient contracts for safety and remains safe on the unit at this time.
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
## 2022-07-12 NOTE — Group Note (Signed)
LCSW Group Therapy Note  Group Date: 07/12/2022 Start Time: 1310 End Time: 1400   Type of Therapy and Topic:  Group Therapy: Positive Affirmations  Participation Level:  Active   Description of Group:   This group addressed positive affirmation towards self and others.  Patients went around the room and identified two positive things about themselves and two positive things about a peer in the room.  Patients reflected on how it felt to share something positive with others, to identify positive things about themselves, and to hear positive things from others/ Patients were encouraged to have a daily reflection of positive characteristics or circumstances.   Therapeutic Goals: Patients will verbalize two of their positive qualities Patients will demonstrate empathy for others by stating two positive qualities about a peer in the group Patients will verbalize their feelings when voicing positive self affirmations and when voicing positive affirmations of others Patients will discuss the potential positive impact on their wellness/recovery of focusing on positive traits of self and others.  Summary of Patient Progress:   Patient actively engaged in the discussion.  Patient was able to engage in group discussions and was supportive of others.  Patient displayed fair insight and was appropriate in group.    Therapeutic Modalities:   Cognitive Behavioral Therapy Motivational Interviewing    Rozann Lesches, [place] 07/12/2022  2:14 PM

## 2022-07-12 NOTE — Plan of Care (Signed)
  Problem: Education: Goal: Knowledge of General Education information will improve Description: Including pain rating scale, medication(s)/side effects and non-pharmacologic comfort measures Outcome: Progressing   Problem: Health Behavior/Discharge Planning: Goal: Ability to manage health-related needs will improve Outcome: Progressing   Problem: Clinical Measurements: Goal: Ability to maintain clinical measurements within normal limits will improve Outcome: Progressing Goal: Will remain free from infection Outcome: Progressing Goal: Diagnostic test results will improve Outcome: Progressing Goal: Respiratory complications will improve Outcome: Progressing Goal: Cardiovascular complication will be avoided Outcome: Progressing   Problem: Activity: Goal: Risk for activity intolerance will decrease Outcome: Progressing   Problem: Nutrition: Goal: Adequate nutrition will be maintained Outcome: Progressing   Problem: Coping: Goal: Level of anxiety will decrease Outcome: Progressing   Problem: Elimination: Goal: Will not experience complications related to bowel motility Outcome: Progressing Goal: Will not experience complications related to urinary retention Outcome: Progressing   Problem: Pain Managment: Goal: General experience of comfort will improve Outcome: Progressing   Problem: Safety: Goal: Ability to remain free from injury will improve Outcome: Progressing   Problem: Skin Integrity: Goal: Risk for impaired skin integrity will decrease Outcome: Progressing   Problem: Education: Goal: Knowledge of [place] General Education information/materials will improve Outcome: Progressing Goal: Emotional status will improve Outcome: Progressing Goal: Mental status will improve Outcome: Progressing Goal: Verbalization of understanding the information provided will improve Outcome: Progressing   Problem: Activity: Goal: Interest or engagement in activities will  improve Outcome: Progressing Goal: Sleeping patterns will improve Outcome: Progressing   Problem: Coping: Goal: Ability to verbalize frustrations and anger appropriately will improve Outcome: Progressing Goal: Ability to demonstrate self-control will improve Outcome: Progressing   Problem: Health Behavior/Discharge Planning: Goal: Identification of resources available to assist in meeting health care needs will improve Outcome: Progressing Goal: Compliance with treatment plan for underlying cause of condition will improve Outcome: Progressing   Problem: Physical Regulation: Goal: Ability to maintain clinical measurements within normal limits will improve Outcome: Progressing   Problem: Safety: Goal: Periods of time without injury will increase Outcome: Progressing   

## 2022-07-12 NOTE — BHH Suicide Risk Assessment (Signed)
[facility] INPATIENT:  Family/Significant Other Suicide Prevention Education  Suicide Prevention Education:  Patient Refusal for Family/Significant Other Suicide Prevention Education: The patient Jody Eaton has refused to provide written consent for family/significant other to be provided Family/Significant Other Suicide Prevention Education during admission and/or prior to discharge.  Physician notified.  SPE completed with pt, as pt refused to consent to family contact. SPI pamphlet provided to pt and pt was encouraged to share information with support network, ask questions, and talk about any concerns relating to SPE. Pt denies access to guns/firearms and verbalized understanding of information provided. Mobile Crisis information also provided to pt.  Shirl Harris 07/12/2022, 3:30 PM

## 2022-07-12 NOTE — Progress Notes (Signed)
Pt is calm and cooperative, isolative to bedroom but compliant with medications and unit activities. Pt denies SI Hi AVH.  Continued Monitoring for safety.    07/12/22 0300  Charting Type  Charting Type Shift assessment  Safety Check Verification  Has the RN verified the 15 minute safety check completion? Yes  Neurological  Neuro (WDL) WDL  HEENT  HEENT (WDL) WDL  Respiratory  Respiratory (WDL) WDL  Cardiac  Cardiac (WDL) WDL  Vascular  Vascular (WDL) WDL  Integumentary  Integumentary (WDL) WDL  Braden Scale (Ages 8 and up)  Sensory Perceptions 4  Moisture 4  Activity 4  Mobility 4  Nutrition 2  Friction and Shear 3  Braden Scale Score 21  Musculoskeletal  Musculoskeletal (WDL) WDL  Assistive Device None  Gastrointestinal  Gastrointestinal (WDL) WDL  GU Assessment  Genitourinary (WDL) WDL  Neurological  Level of Consciousness Alert
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
## 2022-07-13 ENCOUNTER — Other Ambulatory Visit: Payer: Self-pay

## 2022-07-13 ENCOUNTER — Other Ambulatory Visit: Payer: Self-pay | Admitting: Psychiatry

## 2022-07-13 DIAGNOSIS — F314 Bipolar disorder, current episode depressed, severe, without psychotic features: Secondary | ICD-10-CM | POA: Diagnosis not present

## 2022-07-13 MED ORDER — NICOTINE 14 MG/24HR TD PT24
14.0000 mg | MEDICATED_PATCH | Freq: Every day | TRANSDERMAL | 0 refills | Status: DC
  Filled 2022-07-13: qty 14, 14d supply, fill #0

## 2022-07-13 MED ORDER — NICOTINE 14 MG/24HR TD PT24: 14.0000 mg | MEDICATED_PATCH | Freq: Every day | TRANSDERMAL | 0 refills | Status: DC

## 2022-07-13 MED ORDER — HALOPERIDOL 5 MG PO TABS: 5.0000 mg | ORAL_TABLET | Freq: Every day | ORAL | 0 refills | Status: DC

## 2022-07-13 MED ORDER — HALOPERIDOL DECANOATE 100 MG/ML IM SOLN: 100.0000 mg | INTRAMUSCULAR | 1 refills | Status: AC

## 2022-07-13 MED ORDER — HALOPERIDOL 5 MG PO TABS
5.0000 mg | ORAL_TABLET | Freq: Every day | ORAL | 0 refills | Status: DC
  Filled 2022-07-13: qty 10, 10d supply, fill #0

## 2022-07-13 MED ORDER — NICOTINE 14 MG/24HR TD PT24: 14.0000 mg | MEDICATED_PATCH | Freq: Every day | TRANSDERMAL | 1 refills | Status: DC

## 2022-07-13 MED ORDER — HALOPERIDOL 5 MG PO TABS: 5.0000 mg | ORAL_TABLET | Freq: Every day | ORAL | 1 refills | Status: DC

## 2022-07-13 MED ORDER — HALOPERIDOL DECANOATE 100 MG/ML IM SOLN
100.0000 mg | INTRAMUSCULAR | Status: DC
  Administered 2022-07-13: 100 mg via INTRAMUSCULAR
  Filled 2022-07-13: qty 1

## 2022-07-13 NOTE — Plan of Care (Signed)
  Problem: Education: Goal: Knowledge of General Education information will improve Description: Including pain rating scale, medication(s)/side effects and non-pharmacologic comfort measures Outcome: Adequate for Discharge   Problem: Coping: Goal: Level of anxiety will decrease Outcome: Adequate for Discharge   Problem: Education: Goal: Emotional status will improve Outcome: Adequate for Discharge Goal: Mental status will improve Outcome: Adequate for Discharge   

## 2022-07-13 NOTE — Progress Notes (Signed)
  [facility] Adult Case Management Discharge Plan :  Will you be returning to the same living situation after discharge:  Yes,  pt plans to return home upon discharge. At discharge, do you have transportation home?: Yes,  pt family to provide transportation.  Do you have the ability to pay for your medications: No.  Release of information consent forms completed and in the chart;  Patient's signature needed at discharge.  Patient to Follow up at:  Follow-up Information     [facility] Follow up.   Why: Walk in schedule for Friday or Monday is 8:30AM to 11:000AM and  1:00PM to 3:00PM. Contact information: [address], [place], [place] [address] [phone]                Next level of care provider has access to [facility] and Suicide Prevention discussed: Yes,  SPE completed with pt.     Has patient been referred to the Quitline?: Yes, faxed on 07/13/22.  Patient has been referred for addiction treatment: Pt. refused referral  Shirl Harris, LCSW 07/13/2022, 2:15 PM

## 2022-07-13 NOTE — BH IP Treatment Plan (Signed)
Interdisciplinary Treatment and Diagnostic Plan Update  07/13/2022 Time of Session: 8:52AM  Jody Eaton MRN: [id number]  Principal Diagnosis: Bipolar 1 disorder, depressed, severe ([place])  Secondary Diagnoses: Principal Problem:   Bipolar 1 disorder, depressed, severe ([place])   Current Medications:  Current Facility-Administered Medications  Medication Dose Route Frequency Provider Last Rate Last Admin   acetaminophen (TYLENOL) tablet 650 mg  650 mg Oral Q6H PRN Bennett, Christal H, NP       alum & mag hydroxide-simeth (MAALOX/MYLANTA) 200-200-20 MG/5ML suspension 30 mL  30 mL Oral Q4H PRN Bennett, Christal H, NP       feeding supplement (ENSURE ENLIVE / ENSURE PLUS) liquid 237 mL  237 mL Oral BID BM Clapacs, John T, MD   237 mL at 07/13/22 1010   haloperidol (HALDOL) tablet 5 mg  5 mg Oral QHS Bennett, Christal H, NP   5 mg at 07/12/22 2108   haloperidol decanoate (HALDOL DECANOATE) 100 MG/ML injection 100 mg  100 mg Intramuscular Q30 days Clapacs, John T, MD   100 mg at 07/13/22 1144   magnesium hydroxide (MILK OF MAGNESIA) suspension 30 mL  30 mL Oral Daily PRN Bennett, Christal H, NP       multivitamin with minerals tablet 1 tablet  1 tablet Oral Daily Clapacs, Madie [place], MD   1 tablet at 07/13/22 0802   nicotine (NICODERM CQ - dosed in mg/24 hours) patch 14 mg  14 mg Transdermal Daily Clapacs, Madie Reno, MD   14 mg at 07/11/22 1704   PTA Medications: Medications Prior to Admission  Medication Sig Dispense Refill Last Dose   diphenhydrAMINE (BENADRYL) 50 MG capsule Take 1 capsule (50 mg total) by mouth at bedtime. (Patient not taking: Reported on 07/10/2022) 30 capsule 2    haloperidol decanoate (HALDOL DECANOATE) 100 MG/ML injection Inject 0.5 mLs (50 mg total) into the muscle every 28 (twenty-eight) days. (Patient not taking: Reported on 07/10/2022) 1 mL 5    [DISCONTINUED] haloperidol (HALDOL) 5 MG tablet Take 1 tablet (5 mg total) by mouth at bedtime. (Patient not taking: Reported on  07/10/2022) 30 tablet 2     Patient Stressors: Occupational concerns   Other: relationship issues    Patient Strengths: Capable of independent living  Communication skills  General fund of knowledge  Physical Health   Treatment Modalities: Medication Management, Group therapy, Case management,  1 to 1 session with clinician, Psychoeducation, Recreational therapy.   Physician Treatment Plan for Primary Diagnosis: Bipolar 1 disorder, depressed, severe ([place]) Long Term Goal(s): Improvement in symptoms so as ready for discharge   Short Term Goals: Ability to identify and develop effective coping behaviors will improve Ability to maintain clinical measurements within normal limits will improve Compliance with prescribed medications will improve Ability to verbalize feelings will improve Ability to disclose and discuss suicidal ideas  Medication Management: Evaluate patient's response, side effects, and tolerance of medication regimen.  Therapeutic Interventions: 1 to 1 sessions, Unit Group sessions and Medication administration.  Evaluation of Outcomes: Adequate for Discharge  Physician Treatment Plan for Secondary Diagnosis: Principal Problem:   Bipolar 1 disorder, depressed, severe ([place])  Long Term Goal(s): Improvement in symptoms so as ready for discharge   Short Term Goals: Ability to identify and develop effective coping behaviors will improve Ability to maintain clinical measurements within normal limits will improve Compliance with prescribed medications will improve Ability to verbalize feelings will improve Ability to disclose and discuss suicidal ideas     Medication Management: Evaluate patient's  response, side effects, and tolerance of medication regimen.  Therapeutic Interventions: 1 to 1 sessions, Unit Group sessions and Medication administration.  Evaluation of Outcomes: Adequate for Discharge   RN Treatment Plan for Primary Diagnosis: Bipolar 1 disorder,  depressed, severe ([place]) Long Term Goal(s): Knowledge of disease and therapeutic regimen to maintain health will improve  Short Term Goals: Ability to demonstrate self-control, Ability to participate in decision making will improve, Ability to verbalize feelings will improve, Ability to disclose and discuss suicidal ideas, Ability to identify and develop effective coping behaviors will improve, and Compliance with prescribed medications will improve  Medication Management: RN will administer medications as ordered by provider, will assess and evaluate patient's response and provide education to patient for prescribed medication. RN will report any adverse and/or side effects to prescribing provider.  Therapeutic Interventions: 1 on 1 counseling sessions, Psychoeducation, Medication administration, Evaluate responses to treatment, Monitor vital signs and CBGs as ordered, Perform/monitor CIWA, COWS, AIMS and Fall Risk screenings as ordered, Perform wound care treatments as ordered.  Evaluation of Outcomes: Adequate for Discharge   LCSW Treatment Plan for Primary Diagnosis: Bipolar 1 disorder, depressed, severe ([place]) Long Term Goal(s): Safe transition to appropriate next level of care at discharge, Engage patient in therapeutic group addressing interpersonal concerns.  Short Term Goals: Engage patient in aftercare planning with referrals and resources, Increase social support, Increase ability to appropriately verbalize feelings, Increase emotional regulation, Facilitate acceptance of mental health diagnosis and concerns, and Increase skills for wellness and recovery  Therapeutic Interventions: Assess for all discharge needs, 1 to 1 time with Social worker, Explore available resources and support systems, Assess for adequacy in community support network, Educate family and significant other(s) on suicide prevention, Complete Psychosocial Assessment, Interpersonal group therapy.  Evaluation of  Outcomes: Adequate for Discharge   Progress in Treatment: Attending groups: Yes. Participating in groups: Yes. Taking medication as prescribed: Yes. Toleration medication: Yes. Family/Significant other contact made: No, will contact:  once permission is given. Patient understands diagnosis: Yes. Discussing patient identified problems/goals with staff: Yes. Medical problems stabilized or resolved: Yes. Denies suicidal/homicidal ideation: Yes. Issues/concerns per patient self-inventory: No. Other: none  New problem(s) identified: No, Describe:  none  New Short Term/Long Term Goal(s): medication management for mood stabilization; elimination of SI thoughts; development of comprehensive mental wellness/sobriety plan.   Patient Goals:  "get rest, get my appetite together, go home"   Discharge Plan or Barriers: CSW to assist patient in development of appropriate discharge plans.  Patient reports plans to return to the her home and follow up with [place] for aftercare plans.   Reason for Continuation of Hospitalization: Anxiety Depression Medication stabilization Suicidal ideation  Estimated Length of Stay:  1-7 days  Last 3 [place] Suicide Severity Risk Score: [place] Admission (Current) from 07/11/2022 in [place] ED from 07/10/2022 in [facility] Emergency Department at [place] Error: Question 2 not populated Error: Question 2 not populated       Last [facility] 2/9 Scores:     No data to display          Scribe for Treatment Team: Rozann Lesches, LCSW 07/13/2022 1:32 PM

## 2022-07-13 NOTE — BHH Suicide Risk Assessment (Signed)
[facility] Discharge Suicide Risk Assessment   Principal Problem: Bipolar 1 disorder, depressed, severe ([place]) Discharge Diagnoses: Principal Problem:   Bipolar 1 disorder, depressed, severe ([place])   Total Time spent with patient: 30 minutes  Musculoskeletal: Strength & Muscle Tone: within normal limits Gait & Station: normal Patient leans: N/A  Psychiatric Specialty Exam  Presentation  General Appearance: No data recorded Eye Contact:No data recorded Speech:No data recorded Speech Volume:No data recorded Handedness:No data recorded  Mood and Affect  Mood:No data recorded Duration of Depression Symptoms: Greater than two weeks  Affect:No data recorded  Thought Process  Thought Processes:No data recorded Descriptions of Associations:No data recorded Orientation:No data recorded Thought Content:No data recorded History of Schizophrenia/Schizoaffective disorder:No  Duration of Psychotic Symptoms:No data recorded Hallucinations:No data recorded Ideas of Reference:No data recorded Suicidal Thoughts:No data recorded Homicidal Thoughts:No data recorded  Sensorium  Memory:No data recorded Judgment:No data recorded Insight:No data recorded  Executive Functions  Concentration:No data recorded Attention Span:No data recorded Recall:No data recorded Fund of Knowledge:No data recorded Language:No data recorded  Psychomotor Activity  Psychomotor Activity:No data recorded  Assets  Assets:No data recorded  Sleep  Sleep:No data recorded  Physical Exam: Physical Exam Constitutional:      Appearance: Normal appearance.  HENT:     Head: Normocephalic and atraumatic.     Mouth/Throat:     Pharynx: Oropharynx is clear.  Eyes:     Pupils: Pupils are equal, round, and reactive to light.  Cardiovascular:     Rate and Rhythm: Normal rate and regular rhythm.  Pulmonary:     Effort: Pulmonary effort is normal.     Breath sounds: Normal breath sounds.  Abdominal:      General: Abdomen is flat.     Palpations: Abdomen is soft.  Musculoskeletal:        General: Normal range of motion.  Skin:    General: Skin is warm and dry.  Neurological:     General: No focal deficit present.     Mental Status: She is alert. Mental status is at baseline.  Psychiatric:        Mood and Affect: Mood normal.        Behavior: Behavior normal.        Thought Content: Thought content normal.        Judgment: Judgment normal.    Review of Systems  Constitutional: Negative.   HENT: Negative.    Eyes: Negative.   Respiratory: Negative.    Cardiovascular: Negative.   Gastrointestinal: Negative.   Musculoskeletal: Negative.   Skin: Negative.   Neurological: Negative.   Psychiatric/Behavioral: Negative.     Blood pressure 109/82, pulse 92, temperature 98.2 F (36.8 C), temperature source Oral, resp. rate 18, height '5\' 1"'$  (1.549 m), weight 57.6 kg, last menstrual period 07/09/2022, SpO2 99 %. Body mass index is 24 kg/m.  Mental Status Per Nursing Assessment::   On Admission:  NA  Demographic Factors:  Living alone and Unemployed  Loss Factors: Loss of significant relationship  Historical Factors: Impulsivity  Risk Reduction Factors:   Positive social support  Continued Clinical Symptoms:  Depression:   Impulsivity  Cognitive Features That Contribute To Risk:  None    Suicide Risk:  Minimal: No identifiable suicidal ideation.  Patients presenting with no risk factors but with morbid ruminations; may be classified as minimal risk based on the severity of the depressive symptoms    Plan Of Care/Follow-up recommendations:  Other:  Patient appears to have returned to baseline and  denies any suicidal ideation or psychotic symptoms.  Not showing any behavior problems.  Affect upbeat.  Tolerating medicine.  Plan to renew long acting injectable Haldol and [place].  Alethia Berthold, MD 07/13/2022, 9:30 AM

## 2022-07-13 NOTE — Progress Notes (Signed)
Discharge note: SSP and survey complete. RN met with pt and reviewed pt's discharge instructions. Pt verbalized understanding of discharge instructions and pt did not have any questions. RN reviewed and provided pt with a copy of SRA, AVS and Transition Record. RN returned pt's belongings to pt. Prescriptions were given to pt. Pt denied SI/HI/AVH and voiced no concerns. Pt was appreciative of the care pt received at [facility]. Patient discharged to the lobby without incident.  07/13/22 0802  Psych Admission Type (Psych Patients Only)  Admission Status Voluntary  Psychosocial Assessment  Patient Complaints None  Eye Contact Fair  Facial Expression Animated  Affect Appropriate to circumstance  Speech Logical/coherent  Interaction Assertive  Motor Activity Other (Comment) (WDL)  Appearance/Hygiene Unremarkable  Behavior Characteristics Cooperative;Appropriate to situation;Calm  Mood Euphoric  Aggressive Behavior  Effect No apparent injury  Thought Process  Coherency WDL  Content WDL  Delusions None reported or observed  Perception WDL  Hallucination None reported or observed  Judgment WDL  Confusion WDL  Danger to Self  Current suicidal ideation? Denies  Danger to Others  Danger to Others None reported or observed

## 2022-07-13 NOTE — Discharge Summary (Signed)
Physician Discharge Summary Note  Patient:  Jody Eaton is an 42 y.o., female MRN:  [id number] DOB:  1981/02/10 Patient phone:  [phone] (home)  Patient address:   [address] [place] [address],  Total Time spent with patient: 30 minutes  Date of Admission:  07/11/2022 Date of Discharge: 07/13/2022  Reason for Admission: Patient presented to the emergency room with recent onset poor sleep agitation and mood instability and reported suicidal ideation.  Principal Problem: Bipolar 1 disorder, depressed, severe ([place]) Discharge Diagnoses: Principal Problem:   Bipolar 1 disorder, depressed, severe ([place])   Past Psychiatric History: Patient has a history of recurrent episodes of transient mood and psychotic symptoms often precipitated by lack of sleep which tend to resolve very quickly.  Uncertain exact diagnosis either recurrent brief psychotic episode or bipolar disorder  Past Medical History:  Past Medical History:  Diagnosis Date   Bipolar 1 disorder ([place])    Schizophrenia ([place])    History reviewed. No pertinent surgical history. Family History: History reviewed. No pertinent family history. Family Psychiatric  History: See previous Social History:  Social History   Substance and Sexual Activity  Alcohol Use Yes   Alcohol/week: 17.0 standard drinks of alcohol   Types: 7 Glasses of wine, 10 Shots of liquor per week     Social History   Substance and Sexual Activity  Drug Use No    Social History   Socioeconomic History   Marital status: Single    Spouse name: Not on file   Number of children: Not on file   Years of education: Not on file   Highest education level: Not on file  Occupational History   Not on file  Tobacco Use   Smoking status: Light Smoker    Packs/day: 0.25    Years: 1.00    Total pack years: 0.25    Types: Cigarettes   Smokeless tobacco: Never  Substance and Sexual Activity   Alcohol use: Yes    Alcohol/week: 17.0 standard drinks of  alcohol    Types: 7 Glasses of wine, 10 Shots of liquor per week   Drug use: No   Sexual activity: Yes    Birth control/protection: Condom  Other Topics Concern   Not on file  Social History Narrative   Not on file   Social Determinants of Health   Financial Resource Strain: Not on file  Food Insecurity: No Food Insecurity (07/11/2022)   Hunger Vital Sign    Worried About Running Out of Food in the Last Year: Never true    Ran Out of Food in the Last Year: Never true  Transportation Needs: No Transportation Needs (07/11/2022)   PRAPARE - Hydrologist (Medical): No    Lack of Transportation (Non-Medical): No  Physical Activity: Not on file  Stress: Not on file  Social Connections: Not on file    Hospital Course: Admitted to psychiatric hospital.  Had only been on the Haldol a day when symptoms largely resolved.  Affect on the unit has been upbeat thoughts lucid no evidence of psychosis.  Denies hallucination he denies delusions or paranoia.  Denies any suicidal thought at all.  Good self-care no evident behavior problems.  Recommended to the patient that we restart haloperidol decanoate which had been helpful in the past and she was agreeable.  Shot will be given today and then she will be discharged on current medication with referral to outpatient treatment in [place].  Physical Findings: AIMS:  Facial and Oral Movements Muscles of Facial Expression: None, normal Lips and Perioral Area: None, normal Jaw: None, normal Tongue: None, normal,Extremity Movements Upper (arms, wrists, hands, fingers): None, normal Lower (legs, knees, ankles, toes): None, normal, Trunk Movements Neck, shoulders, hips: None, normal, Overall Severity Severity of abnormal movements (highest score from questions above): None, normal Incapacitation due to abnormal movements: None, normal Patient's awareness of abnormal movements (rate only patient's report): No Awareness, Dental  Status Current problems with teeth and/or dentures?: No Does patient usually wear dentures?: No  CIWA:    COWS:     Musculoskeletal: Strength & Muscle Tone: within normal limits Gait & Station: normal Patient leans: N/A   Psychiatric Specialty Exam:  Presentation  General Appearance: No data recorded Eye Contact:No data recorded Speech:No data recorded Speech Volume:No data recorded Handedness:No data recorded  Mood and Affect  Mood:No data recorded Affect:No data recorded  Thought Process  Thought Processes:No data recorded Descriptions of Associations:No data recorded Orientation:No data recorded Thought Content:No data recorded History of Schizophrenia/Schizoaffective disorder:No  Duration of Psychotic Symptoms:No data recorded Hallucinations:No data recorded Ideas of Reference:No data recorded Suicidal Thoughts:No data recorded Homicidal Thoughts:No data recorded  Sensorium  Memory:No data recorded Judgment:No data recorded Insight:No data recorded  Executive Functions  Concentration:No data recorded Attention Span:No data recorded Recall:No data recorded Fund of Knowledge:No data recorded Language:No data recorded  Psychomotor Activity  Psychomotor Activity:No data recorded  Assets  Assets:No data recorded  Sleep  Sleep:No data recorded   Physical Exam: Physical Exam Vitals and nursing note reviewed.  Constitutional:      Appearance: Normal appearance.  HENT:     Head: Normocephalic and atraumatic.     Mouth/Throat:     Pharynx: Oropharynx is clear.  Eyes:     Pupils: Pupils are equal, round, and reactive to light.  Cardiovascular:     Rate and Rhythm: Normal rate and regular rhythm.  Pulmonary:     Effort: Pulmonary effort is normal.     Breath sounds: Normal breath sounds.  Abdominal:     General: Abdomen is flat.     Palpations: Abdomen is soft.  Musculoskeletal:        General: Normal range of motion.  Skin:    General: Skin  is warm and dry.  Neurological:     General: No focal deficit present.     Mental Status: She is alert. Mental status is at baseline.  Psychiatric:        Mood and Affect: Mood normal.        Behavior: Behavior normal.        Thought Content: Thought content normal.        Judgment: Judgment normal.    Review of Systems  Constitutional: Negative.   HENT: Negative.    Eyes: Negative.   Respiratory: Negative.    Cardiovascular: Negative.   Gastrointestinal: Negative.   Musculoskeletal: Negative.   Skin: Negative.   Neurological: Negative.   Psychiatric/Behavioral: Negative.     Blood pressure 109/82, pulse 92, temperature 98.2 F (36.8 C), temperature source Oral, resp. rate 18, height '5\' 1"'$  (1.549 m), weight 57.6 kg, last menstrual period 07/09/2022, SpO2 99 %. Body mass index is 24 kg/m.   Social History   Tobacco Use  Smoking Status Light Smoker   Packs/day: 0.25   Years: 1.00   Total pack years: 0.25   Types: Cigarettes  Smokeless Tobacco Never   Tobacco Cessation:  A prescription for an FDA-approved tobacco cessation medication provided  at discharge   Blood Alcohol level:  Lab Results  Component Value Date   ETH <10 07/10/2022   ETH 86 (H) [id number]    Metabolic Disorder Labs:  Lab Results  Component Value Date   HGBA1C 5.1 05/03/2016   MPG 100 05/03/2016   No results found for: "PROLACTIN" Lab Results  Component Value Date   CHOL 175 05/03/2016   TRIG 75 05/03/2016   HDL 71 05/03/2016   CHOLHDL 2.5 05/03/2016   VLDL 15 05/03/2016   LDLCALC 89 05/03/2016    See Psychiatric Specialty Exam and Suicide Risk Assessment completed by Attending Physician prior to discharge.  Discharge destination:  Home  Is patient on multiple antipsychotic therapies at discharge:  No   Has Patient had three or more failed trials of antipsychotic monotherapy by history:  No  Recommended Plan for Multiple Antipsychotic Therapies: NA  Discharge Instructions      Diet - low sodium heart healthy   Complete by: As directed    Increase activity slowly   Complete by: As directed       Allergies as of 07/13/2022   No Known Allergies      Medication List     STOP taking these medications    diphenhydrAMINE 50 MG capsule Commonly known as: BENADRYL       TAKE these medications      Indication  haloperidol 5 MG tablet Commonly known as: HALDOL Take 1 tablet (5 mg total) by mouth at bedtime.  Indication: Psychosis   haloperidol decanoate 100 MG/ML injection Commonly known as: HALDOL DECANOATE Inject 1 mL (100 mg total) into the muscle every 30 (thirty) days. Start taking on: August 10, 2022 What changed:  how much to take when to take this  Indication: Hypomanic Episode of Bipolar Disorder, MIXED BIPOLAR AFFECTIVE DISORDER   nicotine 14 mg/24hr patch Commonly known as: NICODERM CQ - dosed in mg/24 hours Place 1 patch (14 mg total) onto the skin daily.  Indication: Nicotine Addiction         Follow-up recommendations:  Other:  Follow-up with outpatient treatment in [place].  Continue haloperidol for now and then discontinue after next haloperidol decanoate shot.  Comments: See above  Signed: Alethia Berthold, MD 07/13/2022, 9:59 AM

## 2022-07-13 NOTE — Group Note (Signed)
Recreation Therapy Group Note   Group Topic:Other  Group Date: 07/13/2022 Start Time: 1000 End Time: 1001 Facilitators: Vilma Prader, LRT Location:  N/A  Group was not held due to LRT attending Fit Testing Trainer Class.   Vilma Prader, LRT, CTRS 07/13/2022 11:31 AM

## 2022-08-21 ENCOUNTER — Emergency Department
Admission: EM | Admit: 2022-08-21 | Discharge: 2022-08-22 | Disposition: A | Discharge status: Home / Self Care | Payer: No Typology Code available for payment source | Attending: Emergency Medicine | Admitting: Emergency Medicine

## 2022-08-21 ENCOUNTER — Encounter: Payer: Self-pay | Admitting: Emergency Medicine

## 2022-08-21 ENCOUNTER — Other Ambulatory Visit: Payer: Self-pay

## 2022-08-21 DIAGNOSIS — Z20822 Contact with and (suspected) exposure to covid-19: Secondary | ICD-10-CM | POA: Insufficient documentation

## 2022-08-21 DIAGNOSIS — G4700 Insomnia, unspecified: Secondary | ICD-10-CM | POA: Insufficient documentation

## 2022-08-21 DIAGNOSIS — F203 Undifferentiated schizophrenia: Secondary | ICD-10-CM | POA: Diagnosis not present

## 2022-08-21 DIAGNOSIS — R45851 Suicidal ideations: Secondary | ICD-10-CM | POA: Insufficient documentation

## 2022-08-21 DIAGNOSIS — Y900 Blood alcohol level of less than 20 mg/100 ml: Secondary | ICD-10-CM | POA: Diagnosis not present

## 2022-08-21 DIAGNOSIS — Z91148 Patient's other noncompliance with medication regimen for other reason: Secondary | ICD-10-CM | POA: Insufficient documentation

## 2022-08-21 DIAGNOSIS — F319 Bipolar disorder, unspecified: Secondary | ICD-10-CM | POA: Diagnosis not present

## 2022-08-21 DIAGNOSIS — R456 Violent behavior: Secondary | ICD-10-CM | POA: Diagnosis present

## 2022-08-21 DIAGNOSIS — Z91199 Patient's noncompliance with other medical treatment and regimen due to unspecified reason: Secondary | ICD-10-CM

## 2022-08-21 LAB — CBC
HCT: 40.6 % (ref 36.0–46.0)
Hemoglobin: 13.2 g/dL (ref 12.0–15.0)
MCH: 32 pg (ref 26.0–34.0)
MCHC: 32.5 g/dL (ref 30.0–36.0)
MCV: 98.3 fL (ref 80.0–100.0)
Platelets: 302 10*3/uL (ref 150–400)
RBC: 4.13 MIL/uL (ref 3.87–5.11)
RDW: 14.2 % (ref 11.5–15.5)
WBC: 11.6 10*3/uL — ABNORMAL HIGH (ref 4.0–10.5)
nRBC: 0 % (ref 0.0–0.2)

## 2022-08-21 LAB — RESP PANEL BY RT-PCR (RSV, FLU A&B, COVID)  RVPGX2
Influenza A by PCR: NEGATIVE
Influenza B by PCR: NEGATIVE
Resp Syncytial Virus by PCR: NEGATIVE
SARS Coronavirus 2 by RT PCR: NEGATIVE

## 2022-08-21 LAB — COMPREHENSIVE METABOLIC PANEL
ALT: 16 U/L (ref 0–44)
AST: 23 U/L (ref 15–41)
Albumin: 4.2 g/dL (ref 3.5–5.0)
Alkaline Phosphatase: 48 U/L (ref 38–126)
Anion gap: 7 (ref 5–15)
BUN: 6 mg/dL (ref 6–20)
CO2: 25 mmol/L (ref 22–32)
Calcium: 9 mg/dL (ref 8.9–10.3)
Chloride: 104 mmol/L (ref 98–111)
Creatinine, Ser: 0.94 mg/dL (ref 0.44–1.00)
GFR, Estimated: >60 mL/min (ref >60)
Glucose, Bld: 94 mg/dL (ref 70–99)
Potassium: 3 mmol/L — ABNORMAL LOW (ref 3.5–5.1)
Sodium: 136 mmol/L (ref 135–145)
Total Bilirubin: 0.6 mg/dL (ref 0.3–1.2)
Total Protein: 7.9 g/dL (ref 6.5–8.1)

## 2022-08-21 LAB — ETHANOL: Alcohol, Ethyl (B): 17 mg/dL — ABNORMAL HIGH (ref <10)

## 2022-08-21 LAB — SALICYLATE LEVEL: Salicylate Lvl: <7 mg/dL — ABNORMAL LOW (ref 7.0–30.0)

## 2022-08-21 LAB — ACETAMINOPHEN LEVEL: Acetaminophen (Tylenol), Serum: <10 ug/mL — ABNORMAL LOW (ref 10–30)

## 2022-08-21 MED ORDER — DIPHENHYDRAMINE HCL 25 MG PO CAPS
50.0000 mg | ORAL_CAPSULE | Freq: Once | ORAL | Status: AC
  Administered 2022-08-21: 50 mg via ORAL
  Filled 2022-08-21: qty 2

## 2022-08-21 MED ORDER — LORAZEPAM 2 MG PO TABS
2.0000 mg | ORAL_TABLET | Freq: Once | ORAL | Status: AC
  Administered 2022-08-21: 2 mg via ORAL
  Filled 2022-08-21: qty 1

## 2022-08-21 MED ORDER — LORAZEPAM 2 MG/ML IJ SOLN: 2.0000 mg | Freq: Once | INTRAMUSCULAR | Status: AC

## 2022-08-21 MED ORDER — DIPHENHYDRAMINE HCL 50 MG/ML IJ SOLN: 50.0000 mg | Freq: Once | INTRAMUSCULAR | Status: AC

## 2022-08-21 MED ORDER — HALOPERIDOL 5 MG PO TABS
5.0000 mg | ORAL_TABLET | Freq: Once | ORAL | Status: AC
  Administered 2022-08-21: 5 mg via ORAL
  Filled 2022-08-21: qty 1

## 2022-08-21 MED ORDER — HALOPERIDOL LACTATE 5 MG/ML IJ SOLN: 5.0000 mg | Freq: Once | INTRAMUSCULAR | Status: AC

## 2022-08-21 NOTE — ED Notes (Signed)
VOL/Psych consult Ordered/Pending

## 2022-08-21 NOTE — ED Triage Notes (Signed)
Patient reports not being able to sleep "for days."  Patient reports a history of same.  Patient denies HI/SI.  Patient denies A/V hallucinations as well.

## 2022-08-21 NOTE — ED Notes (Signed)
Pt dressed out at this time with this nurse and Layla Maw, Jeremy Johann. After completing dress out pt shoved belongings into chest of tech, this nurse addressed it and made it aware that action was inappropriate. Pt apologizes and calms and returns to chair. Pt requests belongings remain here

## 2022-08-21 NOTE — ED Notes (Addendum)
Pt to 25H with her son at this time. Pt reports insomnia for several days, only a few hours of sleep in that time. Pt states she went to her Dr for more medication but they denied, pt stats that med was Haldol. Pt reports no sleep since not getting that. Pt is elated and has sarcastic tone, otherwise cooperative and answers questions appropriately. No questions at this time, waiting to speak to provider.

## 2022-08-21 NOTE — ED Notes (Signed)
Per security patient on unit walking around female patient looking at him up and down and asked the patient if he knew her, patient replied no.  Patient then to doorway of another female patient and states I know you and other patient replied no I don't know you.  Patient then into other patients room security brought patient out of room and explained she could not go into other patients room and directed patient to her room without difficulty.

## 2022-08-21 NOTE — ED Notes (Signed)
TTS at bedside with pt and son

## 2022-08-21 NOTE — ED Notes (Signed)
Patient observed from nsg station attempting to walk into another patients room, security and EDT on to unit and redirected patient to her room and that she was not allowed into other patients room.  Patient to her room without difficulty.

## 2022-08-21 NOTE — BH Assessment (Signed)
Comprehensive Clinical Assessment (CCA) Screening, Triage and Referral Note  08/21/2022 Khaleah Duer Spraggins [id number] Recommendations for Services/Supports/Treatments: Consulted with Crystal B. NP, who determined pt. needs overnight observation and reassessment in the AM.  Jody Eaton is a 42 year old, English speaking, Black female with a hx of Bipolar I disorder and schizophrenia, acute undifferentiated. Pt presented to [facility] ED Voluntarily due to concerns about pt. presenting with no ability to sleep. Upon assessment, pt. endorsed having severe sleep disturbance, reporting that she has not slept in 2 days. Pt denied having symptoms of depression. Pt reported that she lives alone and that her family relationships are intact. Pt had some insight into why she'd presented to the hospital, explaining that she was unable to get her medications as scheduled. The pt. was hyper religious and had gaps in judgement. Pt appeared manic and impulsively got up mid assessment to wander down the hallway. Pt was initially calm and cooperative; however, pt. became slightly agitated and defensive towards this Clinical research associate. Thoughts were relevant. Pt presented with a labile mood; affect was congruent. Pt denied current SI/HI or AV/H. BAL 17/UDS pending.  Collateral: Son at bedside: Pt's son reported that the pt is not herself and needs her medications to be stabilized. Son explained that the pt is easily agitated and erratic when in this state.   Chief Complaint:  Chief Complaint  Patient presents with   Insomnia   Visit Diagnosis: Bipolar I disorder Noncompliance  Patient Reported Information How did you hear about [place]? Self  What Is the Reason for Your Visit/Call Today? Patient reports not being able to sleep "for days."  Patient reports a history of same.  How Long Has This Been Causing You Problems? <Week  What Do You Feel Would Help You the Most Today? Treatment for Depression or other mood problem;  Medication(s)   Have You Recently Had Any Thoughts About Hurting Yourself? No  Are You Planning to Commit Suicide/Harm Yourself At This time? No   Have you Recently Had Thoughts About Hurting Someone [name]? No  Are You Planning to Harm Someone at This Time? No  Explanation: n/a   Have You Used Any Alcohol or Drugs in the Past 24 Hours? No  How Long Ago Did You Use Drugs or Alcohol? No data recorded What Did You Use and How Much? n/a   Do You Currently Have a Therapist/Psychiatrist? Yes  Name of Therapist/Psychiatrist: [facility]   Have You Been Recently Discharged From Any Office Practice or Programs? No  Explanation of Discharge From Practice/Program: n/a    CCA Screening Triage Referral Assessment Type of Contact: Face-to-Face  Telemedicine Service Delivery:   Is this Initial or Reassessment?   Date Telepsych consult ordered in [facility]:    Time Telepsych consult ordered in [facility]:    Location of Assessment: [facility] ED  Provider Location: [facility] ED    Collateral Involvement: Son   Does Patient Have a Automotive engineer Guardian? No data recorded Name and Contact of Legal Guardian: No data recorded If Minor and Not Living with Parent(s), Who has Custody? n/a  Is CPS involved or ever been involved? Never  Is APS involved or ever been involved? Never   Patient Determined To Be At Risk for Harm To Self or Others Based on Review of Patient Reported Information or Presenting Complaint? No  Method: No Plan  Availability of Means: No access or NA  Intent: Vague intent or NA  Notification Required: No data recorded Additional Information  for Danger to Others Potential: -- (n/a)  Additional Comments for Danger to Others Potential: n/a  Are There Guns or Other Weapons in Your Home? No  Types of Guns/Weapons: n/a  Are These Weapons Safely Secured?                            -- (n/a)  Who Could Verify You Are Able To Have These Secured: n/a  Do You  Have any Outstanding Charges, Pending Court Dates, Parole/Probation? None reported  Contacted To Inform of Risk of Harm To Self or Others: Other: Comment   Does Patient Present under Involuntary Commitment? No    County of Residence: [place]   Patient Currently Receiving the Following Services: Medication Management   Determination of Need: Emergent (2 hours)   Options For Referral: ED Visit   Discharge Disposition:     Bryer Gottsch R Mykai Wendorf, LCAS

## 2022-08-21 NOTE — ED Notes (Signed)
Pt to restroom, asked if she provided a urine sample and she states that she has

## 2022-08-21 NOTE — ED Notes (Signed)
Pt transferred to [facility]

## 2022-08-21 NOTE — ED Provider Notes (Signed)
[facility] Provider Note    Event Date/Time   First MD Initiated Contact with Patient 08/21/22 2005     (approximate)  History   Chief Complaint: Insomnia  HPI  BOYD MURACO is a 42 y.o. female with a past medical history of bipolar, schizophrenia, presents to the emergency department for inability to sleep.  According to the patient for the past 3+ night she has not been sleeping.  Patient denies any hallucinations denies any SI or HI.  Patient states she was here 2 weeks ago for similar episode they admitted her downstairs but then discharged with no new medications.  Here the patient is awake alert she denies any medical complaints.  Patient willing to stay voluntarily to speak to psychiatry to see what they can do to help her.  Physical Exam   Triage Vital Signs: ED Triage Vitals  Enc Vitals Group     BP 08/21/22 1942 (!) 164/126     Pulse Rate 08/21/22 1942 99     Resp 08/21/22 1942 18     Temp 08/21/22 1942 99.1 F (37.3 C)     Temp Source 08/21/22 1942 Oral     SpO2 08/21/22 1942 100 %     Weight 08/21/22 1943 136 lb 7.4 oz (61.9 kg)     Height 08/21/22 1943 5\' 1"  (1.549 m)     Head Circumference --      Peak Flow --      Pain Score 08/21/22 1943 0     Pain Loc --      Pain Edu? --      Excl. in GC? --     Most recent vital signs: Vitals:   08/21/22 1942  BP: (!) 164/126  Pulse: 99  Resp: 18  Temp: 99.1 F (37.3 C)  SpO2: 100%    General: Awake, no distress.  CV:  Good peripheral perfusion.  Regular rate and rhythm  Resp:  Normal effort.  Equal breath sounds bilaterally.  Abd:  No distention.  Soft, nontender.  No rebound or guarding.  ED Results / Procedures / Treatments    MEDICATIONS ORDERED IN ED: Medications - No data to display   IMPRESSION / MDM / ASSESSMENT AND PLAN / ED COURSE  I reviewed the triage vital signs and the nursing notes.  Patient's presentation is most consistent with acute presentation with  potential threat to life or bodily function.  Patient presents to the emergency department for worsening insomnia.  Here patient is awake alert, no medical complaints.  Family member is here with the patient states she lives alone and he is worried because she has been acting somewhat erratic being awake for the last 3 days.  Patient does not appear to be a danger to herself or anybody else denies SI or HI denies drug use.  Does not meet IVC criteria but is willing to stay voluntarily to speak to psychiatry to see what they are able to do to help her.  We will check labs and continue to closely monitor.  Patient agreeable to plan.  Patient's labs are reassuring normal CBC, reassuring chemistry normal renal function negative COVID/flu/RSV, slight alcohol elevation of 17 negative salicylate and acetaminophen level.  Psychiatry has seen and evaluated the patient I will reassess in the morning.  FINAL CLINICAL IMPRESSION(S) / ED DIAGNOSES   Insomnia   Note:  This document was prepared using Dragon voice recognition software and may include unintentional dictation errors.   Jessamy Torosyan,  Caryn Bee, MD 08/21/22 [phone]

## 2022-08-21 NOTE — ED Notes (Signed)
Patient oriented to unit, explained process of rounding and cameras in room.  Patient verbalized understanding.

## 2022-08-22 DIAGNOSIS — G4700 Insomnia, unspecified: Secondary | ICD-10-CM

## 2022-08-22 LAB — URINE DRUG SCREEN, QUALITATIVE (ARMC ONLY)
Amphetamines, Ur Screen: NOT DETECTED
Barbiturates, Ur Screen: NOT DETECTED
Benzodiazepine, Ur Scrn: POSITIVE — AB
Cannabinoid 50 Ng, Ur ~~LOC~~: POSITIVE — AB
Cocaine Metabolite,Ur ~~LOC~~: NOT DETECTED
MDMA (Ecstasy)Ur Screen: NOT DETECTED
Methadone Scn, Ur: NOT DETECTED
Opiate, Ur Screen: NOT DETECTED
Phencyclidine (PCP) Ur S: NOT DETECTED
Tricyclic, Ur Screen: NOT DETECTED

## 2022-08-22 LAB — PREGNANCY, URINE: Preg Test, Ur: NEGATIVE

## 2022-08-22 MED ORDER — HALOPERIDOL DECANOATE 100 MG/ML IM SOLN
100.0000 mg | INTRAMUSCULAR | Status: DC
  Administered 2022-08-22: 100 mg via INTRAMUSCULAR
  Filled 2022-08-22: qty 1

## 2022-08-22 NOTE — Consult Note (Addendum)
[facility] Face-to-Face Psychiatry Consult   Reason for Consult:  Psychiatric Evaluation  Referring Physician:  Dr. Lenard Lance  Patient Identification: Jody Eaton MRN:  [id number] Principal Diagnosis: Bipolar I disorder Diagnosis:  Principal Problem:   Bipolar I disorder Active Problems:   Noncompliance   Schizophrenia, acute undifferentiated   Insomnia   Total Time spent with patient: 45 minutes  Subjective:  "I wasn't sleeping".  Jody Eaton is a 42 y.o. female patient with a psychiatric history significant for bipolar disorder. She presented to the emergency department voluntarily with complaints of insomnia > nights.    Patient seen and chart reviewed. She reports improved sleep last night. She mentions noncompliance with medication, specifically stating she takes prescribed haloperidol "as needed" for sleep, which may indicate no regular use affecting her sleep pattern. She reports a history of receiving haloperidol decanoate injections for management but reports [facility] refused to administer injection due to lack of record.  She describes her energy and concentration as satisfactory and notes an improvement in appetite.  She denies psychomotor agitation or restlessness.    On evaluation, she is alert and oriented x 4. Mood congruent with affect. Speech clear, coherent, logical.  She demonstrates satisfactory concentration and memory. She denies suicidal or homicidal ideation.  She denies past suicide attempts.  Additionally, she denies experiencing auditory or visual hallucinations.  No evidence of delusional thinking present during the evaluation. No evidence of psychomotor agitation or restlessness.  She has been advised to use melatonin 5 mg at bedtime as needed for sleep support. Will prescribe haloperidol decanoate for administration in the hospital. She appears to understand the need for medication but demonstrates poor adherence.  Patient educated on importance of  consistent medication use for management of bipolar symptoms and sleep disturbances.  No acute distress or psychotic symptoms were evident during evaluation. The patient is advised to follow-up with [facility] post-discharge for ongoing support and medication management.    HPI:  Per Dr. Lenard Lance, Jody Eaton is a 42 y.o. female with a past medical history of bipolar, schizophrenia, presents to the emergency department for inability to sleep.  According to the patient for the past 3+ night she has not been sleeping.  Patient denies any hallucinations denies any SI or HI.  Patient states she was here 2 weeks ago for similar episode they admitted her downstairs but then discharged with no new medications.  Here the patient is awake alert she denies any medical complaints.  Patient willing to stay voluntarily to speak to psychiatry to see what they can do to help her.   Past Psychiatric History: Bipolar, schizophrenia. She was admitted to [facility] March 2023 for similar.   Risk to Self:  No  Risk to Others:  No  Prior Inpatient Therapy:  Yes - [facility] March 2024 Prior Outpatient Therapy:    Past Medical History:  Past Medical History:  Diagnosis Date   Bipolar 1 disorder    Schizophrenia    History reviewed. No pertinent surgical history. Family History: History reviewed. No pertinent family history. Family Psychiatric  History: None reported  Social History:  Social History   Substance and Sexual Activity  Alcohol Use Yes   Alcohol/week: 17.0 standard drinks of alcohol   Types: 7 Glasses of wine, 10 Shots of liquor per week     Social History   Substance and Sexual Activity  Drug Use No    Social History   Socioeconomic History   Marital status: Single  Spouse name: Not on file   Number of children: Not on file   Years of education: Not on file   Highest education level: Not on file  Occupational History   Not on file  Tobacco Use   Smoking status: Light Smoker     Packs/day: 0.25    Years: 1.00    Additional pack years: 0.00    Total pack years: 0.25    Types: Cigarettes   Smokeless tobacco: Never  Substance and Sexual Activity   Alcohol use: Yes    Alcohol/week: 17.0 standard drinks of alcohol    Types: 7 Glasses of wine, 10 Shots of liquor per week   Drug use: No   Sexual activity: Yes    Birth control/protection: Condom  Other Topics Concern   Not on file  Social History Narrative   Not on file   Social Determinants of Health   Financial Resource Strain: Not on file  Food Insecurity: No Food Insecurity (07/11/2022)   Hunger Vital Sign    Worried About Running Out of Food in the Last Year: Never true    Ran Out of Food in the Last Year: Never true  Transportation Needs: No Transportation Needs (07/11/2022)   PRAPARE - Administrator, Civil Service (Medical): No    Lack of Transportation (Non-Medical): No  Physical Activity: Not on file  Stress: Not on file  Social Connections: Not on file   Additional Social History:    Allergies:  No Known Allergies  Labs:  Results for orders placed or performed during the hospital encounter of 08/21/22 (from the past 48 hour(s))  Comprehensive metabolic panel     Status: Abnormal   Collection Time: 08/21/22  8:27 PM  Result Value Ref Range   Sodium 136 135 - 145 mmol/L   Potassium 3.0 (L) 3.5 - 5.1 mmol/L   Chloride 104 98 - 111 mmol/L   CO2 25 22 - 32 mmol/L   Glucose, Bld 94 70 - 99 mg/dL    Comment: Glucose reference range applies only to samples taken after fasting for at least 8 hours.   BUN 6 6 - 20 mg/dL   Creatinine, Ser 1.77 0.44 - 1.00 mg/dL   Calcium 9.0 8.9 - 93.9 mg/dL   Total Protein 7.9 6.5 - 8.1 g/dL   Albumin 4.2 3.5 - 5.0 g/dL   AST 23 15 - 41 U/L   ALT 16 0 - 44 U/L   Alkaline Phosphatase 48 38 - 126 U/L   Total Bilirubin 0.6 0.3 - 1.2 mg/dL   GFR, Estimated >03 >00 mL/min    Comment: (NOTE) Calculated using the CKD-EPI Creatinine Equation (2021)     Anion gap 7 5 - 15    Comment: Performed at [facility], [address] [place], [place] [address]  Ethanol     Status: Abnormal   Collection Time: 08/21/22  8:27 PM  Result Value Ref Range   Alcohol, Ethyl (B) 17 (H) <10 mg/dL    Comment: (NOTE) Lowest detectable limit for serum alcohol is 10 mg/dL.  For medical purposes only. Performed at [facility], [address] [place], [place] [address]   Salicylate level     Status: Abnormal   Collection Time: 08/21/22  8:27 PM  Result Value Ref Range   Salicylate Lvl <7.0 (L) 7.0 - 30.0 mg/dL    Comment: Performed at [facility], [address] [place], [place] [address]  Acetaminophen level  Status: Abnormal   Collection Time: 08/21/22  8:27 PM  Result Value Ref Range   Acetaminophen (Tylenol), Serum <10 (L) 10 - 30 ug/mL    Comment: (NOTE) Therapeutic concentrations vary significantly. A range of 10-30 ug/mL  may be an effective concentration for many patients. However, some  are best treated at concentrations outside of this range. Acetaminophen concentrations >150 ug/mL at 4 hours after ingestion  and >50 ug/mL at 12 hours after ingestion are often associated with  toxic reactions.  Performed at [facility], [address] [place], [place] [address]   cbc     Status: Abnormal   Collection Time: 08/21/22  8:27 PM  Result Value Ref Range   WBC 11.6 (H) 4.0 - 10.5 K/uL   RBC 4.13 3.87 - 5.11 MIL/uL   Hemoglobin 13.2 12.0 - 15.0 g/dL   HCT 60.4 54.0 - 98.1 %   MCV 98.3 80.0 - 100.0 fL   MCH 32.0 26.0 - 34.0 pg   MCHC 32.5 30.0 - 36.0 g/dL   RDW 19.1 47.8 - 29.5 %   Platelets 302 150 - 400 K/uL   nRBC 0.0 0.0 - 0.2 %    Comment: Performed at [facility], [address], [place], [place] [address]  Resp panel by RT-PCR (RSV, Flu A&B, Covid) Anterior Nasal Swab     Status: None   Collection Time: 08/21/22  8:27 PM   Specimen: Anterior Nasal Swab  Result Value Ref  Range   SARS Coronavirus 2 by RT PCR NEGATIVE NEGATIVE    Comment: (NOTE) SARS-CoV-2 target nucleic acids are NOT DETECTED.  The SARS-CoV-2 RNA is generally detectable in upper respiratory specimens during the acute phase of infection. The lowest concentration of SARS-CoV-2 viral copies this assay can detect is 138 copies/mL. A negative result does not preclude SARS-Cov-2 infection and should not be used as the sole basis for treatment or other patient management decisions. A negative result may occur with  improper specimen collection/handling, submission of specimen other than nasopharyngeal swab, presence of viral mutation(s) within the areas targeted by this assay, and inadequate number of viral copies(<138 copies/mL). A negative result must be combined with clinical observations, patient history, and epidemiological information. The expected result is Negative.  Fact Sheet for Patients:  BloggerCourse.com  Fact Sheet for Healthcare Providers:  SeriousBroker.it  This test is no t yet approved or cleared by the [place] FDA and  has been authorized for detection and/or diagnosis of SARS-CoV-2 by FDA under an Emergency Use Authorization (EUA). This EUA will remain  in effect (meaning this test can be used) for the duration of the COVID-19 declaration under Section 564(b)(1) of the Act, 21 U.S.C.section 360bbb-3(b)(1), unless the authorization is terminated  or revoked sooner.       Influenza A by PCR NEGATIVE NEGATIVE   Influenza B by PCR NEGATIVE NEGATIVE    Comment: (NOTE) The Xpert Xpress SARS-CoV-2/FLU/RSV plus assay is intended as an aid in the diagnosis of influenza from Nasopharyngeal swab specimens and should not be used as a sole basis for treatment. Nasal washings and aspirates are unacceptable for Xpert Xpress SARS-CoV-2/FLU/RSV testing.  Fact Sheet for  Patients: BloggerCourse.com  Fact Sheet for Healthcare Providers: SeriousBroker.it  This test is not yet approved or cleared by the [place] FDA and has been authorized for detection and/or diagnosis of SARS-CoV-2 by FDA under an Emergency Use Authorization (EUA). This EUA will remain in effect (meaning this test can be used) for  the duration of the COVID-19 declaration under Section 564(b)(1) of the Act, 21 U.S.C. section 360bbb-3(b)(1), unless the authorization is terminated or revoked.     Resp Syncytial Virus by PCR NEGATIVE NEGATIVE    Comment: (NOTE) Fact Sheet for Patients: BloggerCourse.com  Fact Sheet for Healthcare Providers: SeriousBroker.it  This test is not yet approved or cleared by the [place] FDA and has been authorized for detection and/or diagnosis of SARS-CoV-2 by FDA under an Emergency Use Authorization (EUA). This EUA will remain in effect (meaning this test can be used) for the duration of the COVID-19 declaration under Section 564(b)(1) of the Act, 21 U.S.C. section 360bbb-3(b)(1), unless the authorization is terminated or revoked.  Performed at [facility], [address] [place], [place] [address]   Urine Drug Screen, Qualitative     Status: Abnormal   Collection Time: 08/22/22  9:57 AM  Result Value Ref Range   Tricyclic, Ur Screen NONE DETECTED NONE DETECTED   Amphetamines, Ur Screen NONE DETECTED NONE DETECTED   MDMA (Ecstasy)Ur Screen NONE DETECTED NONE DETECTED   Cocaine Metabolite,Ur [place] NONE DETECTED NONE DETECTED   Opiate, Ur Screen NONE DETECTED NONE DETECTED   Phencyclidine (PCP) Ur S NONE DETECTED NONE DETECTED   Cannabinoid 50 Ng, Ur [place] POSITIVE (A) NONE DETECTED   Barbiturates, Ur Screen NONE DETECTED NONE DETECTED   Benzodiazepine, Ur Scrn POSITIVE (A) NONE DETECTED   Methadone Scn, Ur NONE DETECTED NONE DETECTED     Comment: (NOTE) Tricyclics + metabolites, urine    Cutoff 1000 ng/mL Amphetamines + metabolites, urine  Cutoff 1000 ng/mL MDMA (Ecstasy), urine              Cutoff 500 ng/mL Cocaine Metabolite, urine          Cutoff 300 ng/mL Opiate + metabolites, urine        Cutoff 300 ng/mL Phencyclidine (PCP), urine         Cutoff 25 ng/mL Cannabinoid, urine                 Cutoff 50 ng/mL Barbiturates + metabolites, urine  Cutoff 200 ng/mL Benzodiazepine, urine              Cutoff 200 ng/mL Methadone, urine                   Cutoff 300 ng/mL  The urine drug screen provides only a preliminary, unconfirmed analytical test result and should not be used for non-medical purposes. Clinical consideration and professional judgment should be applied to any positive drug screen result due to possible interfering substances. A more specific alternate chemical method must be used in order to obtain a confirmed analytical result. Gas chromatography / mass spectrometry (GC/MS) is the preferred confirm atory method. Performed at [facility], [address] [place], [place] [address]   Pregnancy, urine     Status: None   Collection Time: 08/22/22  9:58 AM  Result Value Ref Range   Preg Test, Ur NEGATIVE NEGATIVE    Comment: Performed at [facility], [address] [place], [place] [address]    Current Facility-Administered Medications  Medication Dose Route Frequency Provider Last Rate Last Admin   haloperidol decanoate (HALDOL DECANOATE) 100 MG/ML injection 100 mg  100 mg Intramuscular Q30 days Willeen Cass, Aanya Haynes H, NP   100 mg at 08/22/22 1059   Current Outpatient Medications  Medication Sig Dispense Refill   haloperidol (HALDOL) 5 MG tablet Take 1 tablet (5 mg total)  by mouth at bedtime. 30 tablet 0   haloperidol decanoate (HALDOL DECANOATE) 100 MG/ML injection Inject 1 mL (100 mg total) into the muscle every 30 (thirty) days. 1 mL 1   nicotine (NICODERM CQ - DOSED IN MG/24  HOURS) 14 mg/24hr patch Place 1 patch (14 mg total) onto the skin daily. 14 patch 0    Musculoskeletal: Strength & Muscle Tone: within normal limits Gait & Station: normal Patient leans: N/A            Psychiatric Specialty Exam:  Presentation  General Appearance: Appropriate for Environment  Eye Contact:Good  Speech:Clear and Coherent  Speech Volume:Normal  Handedness:Right   Mood and Affect  Mood:-- ("I'm doing good".)  Affect:Congruent   Thought Process  Thought Processes:Coherent  Descriptions of Associations:Intact  Orientation:Full (Time, Place and Person)  Thought Content:Logical; WDL  History of Schizophrenia/Schizoaffective disorder:Yes  Duration of Psychotic Symptoms:No data recorded Hallucinations:Hallucinations: None  Ideas of Reference:None  Suicidal Thoughts:Suicidal Thoughts: No  Homicidal Thoughts:Homicidal Thoughts: No   Sensorium  Memory:Immediate Good; Remote Good  Judgment:Fair  Insight:Good   Executive Functions  Concentration:Good  Attention Span:Good  Recall:Good  Fund of Knowledge:Good  Language:Good   Psychomotor Activity  Psychomotor Activity:Psychomotor Activity: Normal   Assets  Assets:Communication Skills; Desire for Improvement; Housing; Physical Health; Resilience; Social Support   Sleep  Sleep:Sleep: Fair   Physical Exam: Physical Exam HENT:     Head: Normocephalic and atraumatic.     Nose: Nose normal.  Pulmonary:     Effort: Pulmonary effort is normal.  Musculoskeletal:        General: Normal range of motion.     Cervical back: Normal range of motion.  Neurological:     General: No focal deficit present.     Mental Status: She is alert and oriented to person, place, and time.  Psychiatric:        Attention and Perception: Attention and perception normal.        Mood and Affect: Mood and affect normal.        Speech: Speech normal.        Behavior: Behavior normal. Behavior is  cooperative.        Thought Content: Thought content normal. Thought content is not paranoid or delusional. Thought content does not include homicidal or suicidal ideation.        Cognition and Memory: Cognition and memory normal.        Judgment: Judgment normal.    ROS Blood pressure (!) 132/99, pulse 92, temperature 98.4 F (36.9 C), resp. rate 16, height  (1.549 m), weight 61.9 kg, last menstrual period 08/03/2022, SpO2 100 %. Body mass index is 25.78 kg/m.  Treatment Plan Summary: Medication management and Plan : Will prescribe haloperidol decanoate for administration in the hospital.  Reinforce importance of adherence to prescribed treatment. Encourage patient to take melatonin 5 mg at bedtime as needed for sleep support.  Educate on sleep hygiene practices.  No evidence of acute psychiatric illness at this time.  Patient to follow-up with outpatient services at [facility] for ongoing support and psychiatric management. Plan reviewed with Dr. Ovidio Kin.   Disposition: No evidence of imminent risk to self or others at present.   Patient does not meet criteria for psychiatric inpatient admission. Supportive therapy provided about ongoing stressors. Discussed crisis plan, support from social network, calling 911, coming to the Emergency Department, and calling Suicide Hotline.  Norma Fredrickson, NP 08/22/2022 1:25 PM

## 2022-08-22 NOTE — ED Provider Notes (Addendum)
Emergency Medicine Observation Re-evaluation Note  Jody Eaton is a 42 y.o. female, seen on rounds today.  Pt initially presented to the ED for complaints of Insomnia   Physical Exam  BP (!) 164/126 (BP Location: Left Arm)   Pulse 99   Temp 99.1 F (37.3 C) (Oral)   Resp 18   Ht 5\' 1"  (1.549 m)   Wt 61.9 kg   LMP 08/03/2022 (Approximate)   SpO2 100%   BMI 25.78 kg/m  Physical Exam General: NAD  ED Course / MDM  EKG:   I have reviewed the labs performed to date as well as medications administered while in observation.    Plan  The patient has been evaluated at bedside by  psychiatry.  Patient is clinically stable.  Not felt to be a danger to self or others.  No SI or Hi.  No indication for inpatient psychiatric admission at this time.  She is here voluntarily.  Have recommended Haldol decanoate injection then will be appropriate appropriate for continued outpatient therapy.      Willy Eddy, MD 08/22/22 [phone]

## 2022-08-22 NOTE — ED Notes (Signed)
Lunch tray provided with water.

## 2022-08-22 NOTE — ED Notes (Signed)
Pt called her son for a ride home; pt awaiting son arrival.

## 2022-08-22 NOTE — ED Notes (Addendum)
Breakfast tray provided and VS obtained. Pt reminded that a urine sample is needed. Verbalized understanding and said she would try after breakfast

## 2023-03-07 ENCOUNTER — Emergency Department
Admission: EM | Admit: 2023-03-07 | Discharge: 2023-03-08 | Disposition: A | Discharge status: Home / Self Care | Payer: MEDICAID | Attending: Emergency Medicine | Admitting: Emergency Medicine

## 2023-03-07 ENCOUNTER — Other Ambulatory Visit: Payer: Self-pay

## 2023-03-07 DIAGNOSIS — G4700 Insomnia, unspecified: Secondary | ICD-10-CM | POA: Insufficient documentation

## 2023-03-07 DIAGNOSIS — F250 Schizoaffective disorder, bipolar type: Secondary | ICD-10-CM | POA: Insufficient documentation

## 2023-03-07 DIAGNOSIS — F17210 Nicotine dependence, cigarettes, uncomplicated: Secondary | ICD-10-CM | POA: Insufficient documentation

## 2023-03-07 LAB — URINE DRUG SCREEN, QUALITATIVE (ARMC ONLY)
Amphetamines, Ur Screen: NOT DETECTED
Barbiturates, Ur Screen: NOT DETECTED
Benzodiazepine, Ur Scrn: NOT DETECTED
Cannabinoid 50 Ng, Ur ~~LOC~~: NOT DETECTED
Cocaine Metabolite,Ur ~~LOC~~: NOT DETECTED
MDMA (Ecstasy)Ur Screen: NOT DETECTED
Methadone Scn, Ur: NOT DETECTED
Opiate, Ur Screen: NOT DETECTED
Phencyclidine (PCP) Ur S: NOT DETECTED
Tricyclic, Ur Screen: NOT DETECTED

## 2023-03-07 LAB — COMPREHENSIVE METABOLIC PANEL
ALT: 21 U/L (ref 0–44)
AST: 24 U/L (ref 15–41)
Albumin: 3.9 g/dL (ref 3.5–5.0)
Alkaline Phosphatase: 39 U/L (ref 38–126)
Anion gap: 7 (ref 5–15)
BUN: 6 mg/dL (ref 6–20)
CO2: 24 mmol/L (ref 22–32)
Calcium: 8.9 mg/dL (ref 8.9–10.3)
Chloride: 105 mmol/L (ref 98–111)
Creatinine, Ser: 0.76 mg/dL (ref 0.44–1.00)
GFR, Estimated: >60 mL/min (ref >60)
Glucose, Bld: 113 mg/dL — ABNORMAL HIGH (ref 70–99)
Potassium: 3.9 mmol/L (ref 3.5–5.1)
Sodium: 136 mmol/L (ref 135–145)
Total Bilirubin: 0.4 mg/dL (ref 0.3–1.2)
Total Protein: 7.3 g/dL (ref 6.5–8.1)

## 2023-03-07 LAB — CBC WITH DIFFERENTIAL/PLATELET
Abs Immature Granulocytes: 0.01 10*3/uL (ref 0.00–0.07)
Basophils Absolute: 0.1 10*3/uL (ref 0.0–0.1)
Basophils Relative: 1 %
Eosinophils Absolute: 0.2 10*3/uL (ref 0.0–0.5)
Eosinophils Relative: 2 %
HCT: 38.7 % (ref 36.0–46.0)
Hemoglobin: 13.1 g/dL (ref 12.0–15.0)
Immature Granulocytes: 0 %
Lymphocytes Relative: 34 %
Lymphs Abs: 3.6 10*3/uL (ref 0.7–4.0)
MCH: 32.6 pg (ref 26.0–34.0)
MCHC: 33.9 g/dL (ref 30.0–36.0)
MCV: 96.3 fL (ref 80.0–100.0)
Monocytes Absolute: 1.2 10*3/uL — ABNORMAL HIGH (ref 0.1–1.0)
Monocytes Relative: 12 %
Neutro Abs: 5.5 10*3/uL (ref 1.7–7.7)
Neutrophils Relative %: 51 %
Platelets: 360 10*3/uL (ref 150–400)
RBC: 4.02 MIL/uL (ref 3.87–5.11)
RDW: 14.4 % (ref 11.5–15.5)
WBC: 10.6 10*3/uL — ABNORMAL HIGH (ref 4.0–10.5)
nRBC: 0 % (ref 0.0–0.2)

## 2023-03-07 LAB — POC URINE PREG, ED: Preg Test, Ur: NEGATIVE

## 2023-03-07 LAB — ETHANOL: Alcohol, Ethyl (B): <10 mg/dL (ref <10)

## 2023-03-07 LAB — ACETAMINOPHEN LEVEL: Acetaminophen (Tylenol), Serum: <10 ug/mL — ABNORMAL LOW (ref 10–30)

## 2023-03-07 LAB — SALICYLATE LEVEL: Salicylate Lvl: <7 mg/dL — ABNORMAL LOW (ref 7.0–30.0)

## 2023-03-07 MED ORDER — LORAZEPAM 2 MG/ML IJ SOLN: 2.0000 mg | Freq: Three times a day (TID) | INTRAMUSCULAR | Status: DC | PRN

## 2023-03-07 MED ORDER — HALOPERIDOL 5 MG PO TABS
5.0000 mg | ORAL_TABLET | Freq: Every day | ORAL | Status: DC
  Administered 2023-03-07 – 2023-03-08 (×2): 5 mg via ORAL
  Filled 2023-03-07 (×2): qty 1

## 2023-03-07 MED ORDER — HALOPERIDOL 5 MG PO TABS: 5.0000 mg | ORAL_TABLET | Freq: Three times a day (TID) | ORAL | Status: DC | PRN

## 2023-03-07 MED ORDER — DIPHENHYDRAMINE HCL 50 MG/ML IJ SOLN: 50.0000 mg | Freq: Three times a day (TID) | INTRAMUSCULAR | Status: DC | PRN

## 2023-03-07 MED ORDER — MELATONIN 5 MG PO TABS: 2.5000 mg | ORAL_TABLET | Freq: Every evening | ORAL | Status: DC | PRN

## 2023-03-07 MED ORDER — LORAZEPAM 2 MG PO TABS: 2.0000 mg | ORAL_TABLET | Freq: Three times a day (TID) | ORAL | Status: DC | PRN

## 2023-03-07 MED ORDER — HALOPERIDOL LACTATE 5 MG/ML IJ SOLN
5.0000 mg | Freq: Three times a day (TID) | INTRAMUSCULAR | Status: DC | PRN
  Filled 2023-03-07: qty 1

## 2023-03-07 MED ORDER — HALOPERIDOL DECANOATE 100 MG/ML IM SOLN
100.0000 mg | INTRAMUSCULAR | Status: DC
  Administered 2023-03-08: 100 mg via INTRAMUSCULAR
  Filled 2023-03-07 (×2): qty 1

## 2023-03-07 MED ORDER — DIPHENHYDRAMINE HCL 25 MG PO CAPS: 50.0000 mg | ORAL_CAPSULE | Freq: Three times a day (TID) | ORAL | Status: DC | PRN

## 2023-03-07 NOTE — ED Notes (Signed)
VOL  CONSULT  DONE  pending  reassessed  in the  am

## 2023-03-07 NOTE — ED Notes (Signed)
Pt. Transferred to BHU , room# 3 from ED .Patient was screened by security before entering the unit. Received Report and Recommendations from [facility], [place] . Pt. Oriented to unit including Q15 minute rounding as well as locked bathroom protocol, meal / snack schedule, and  the security cameras in place for their protection. Patient is A/O x 3, warm / dry.  Showing no acute signs of distress. Staff to monitor as ordered

## 2023-03-07 NOTE — Consult Note (Addendum)
[facility] Face-to-Face Psychiatry Consult   Reason for Consult:  Admit Referring Physician:  Dr. Pilar Jarvis Patient Identification: Jody Eaton MRN:  [id number] Principal Diagnosis: Schizoaffective disorder, bipolar type (HCC) Diagnosis:  Principal Problem:   Schizoaffective disorder, bipolar type (HCC)  Total Time spent with patient, including time spent reviewing pt's chart as well as speaking with collateral: 45 minutes  Subjective:   Jody Eaton is a 42 y.o. female patient w/ history of schizophrenia, bipolar disorder, admitted to [facility], per triage note: Pt arrives via POV. PT reports she has not had any sleep in at least 3 days. Pt AxOx4. Pt denies any si, hi, auditory or visual hallucinations. Pt arrives with family. Family states patient has done this in the past. Family states she has been working way too much.   HPI:   Pt chart reviewed and assessed face to face. Per chart review, pt w/ history of schizophrenia, bipolar disorder. Has had inpatient psychiatric admission at [facility] from 07/11/22-07/13/22 where she was discharged on haldol 5mg  oral daily at bedtime, haldol decanoate 100mg  IM every 30 days, and nicotine 14mg  transdermal daily. Also seen at [facility] from 08/21/22-08/22/22 when she presented with similar complaint of insomnia, during which she was administered haldol decanoate 100mg  on 08/22/22 and discharged.   On assessment today, pt is alert and oriented x4. She states she came to the emergency department with her sister because "need some rest". Reports she has not been sleeping well because of her racing mind that is "going and going" and she "can't turn it off". Reports she has not slept in 3 days. Pt states she works night shift, 7 nights/week, 12 hours/shift. 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 Reports she has been working at her current place of employment since June of this year. Pt lives with her son who is 69 y/o. She states she has 2 other children:  87 y/o daughter, and 12 y/o son that lives with his father and paternal grandparents. Pt reports she has not received her haldol decanoate injection in several months. States she had been doing well while on it. States she last received the injection at [facility] although has not been back in several months. Per pt, was taken off the injection, and does not know reason for this. She denies suicidal, homicidal ideations. She denies auditory visual hallucinations or paranoia. She denies use of alcohol, marijuana, crack/cocaine, methamphetamines, opioids, other substances. Her UDS from 03/07/23 was reviewed and is unremarkable. Alcohol from 03/07/23 is also unremarkable.  Discussed with pt recommendation for overnight assessment with reassessment in the morning. Will restart her on oral haldol today and start her back on the haldol decanoate long acting injectable tomorrow. She was initially reluctant to stay, stating she is doing "fine". We discussed concerns regarding her lack of sleep and how in the past it appears she has become manic/psychotic without sleep. Discussed concerns about her psychiatrically decompensating without intervention. She gave verbal consent for her sister, Missy, to be called and involved in her care.   Missy called during the assessment, [phone]. Missy states pt was supposed to follow up to continue her medications. Unclear whether this occurred. Missy states while she does not have immediate concerns about pt harming herself or others, she is concerned that if this continues pt will become agitated and psychiatrically decompensate as she has done in the past. We discussed the recommendation for pt to stay overnight with reassessment in the morning, starting her  on oral haldol today and start her back on the haldol decanoate long acting injectable tomorrow. She is in agreement. After discussing these concerns, pt was agreeable to staying. Missy states she can pick pt up  tomorrow if pt is discharged.  Past Psychiatric History: bipolar disorder, schizophrenia  Risk to Self: Denies suicidal ideations Risk to Others: Denies homicidal ideations Prior Inpatient Therapy: Yes Prior Outpatient Therapy: Yes  Past Medical History:  Past Medical History:  Diagnosis Date   Bipolar 1 disorder (HCC)    Schizophrenia (HCC)    History reviewed. No pertinent surgical history. Family History: History reviewed. No pertinent family history. Family Psychiatric  History: None reported Social History:  Social History   Substance and Sexual Activity  Alcohol Use Yes   Alcohol/week: 17.0 standard drinks of alcohol   Types: 7 Glasses of wine, 10 Shots of liquor per week     Social History   Substance and Sexual Activity  Drug Use No    Social History   Socioeconomic History   Marital status: Single    Spouse name: Not on file   Number of children: Not on file   Years of education: Not on file   Highest education level: Not on file  Occupational History   Not on file  Tobacco Use   Smoking status: Light Smoker    Current packs/day: 0.25    Average packs/day: 0.3 packs/day for 1 year (0.3 ttl pk-yrs)    Types: Cigarettes   Smokeless tobacco: Never  Substance and Sexual Activity   Alcohol use: Yes    Alcohol/week: 17.0 standard drinks of alcohol    Types: 7 Glasses of wine, 10 Shots of liquor per week   Drug use: No   Sexual activity: Yes    Birth control/protection: Condom  Other Topics Concern   Not on file  Social History Narrative   Not on file   Social Determinants of Health   Financial Resource Strain: Not on file  Food Insecurity: No Food Insecurity (07/11/2022)   Hunger Vital Sign    Worried About Running Out of Food in the Last Year: Never true    Ran Out of Food in the Last Year: Never true  Transportation Needs: No Transportation Needs (07/11/2022)   PRAPARE - Administrator, Civil Service (Medical): No    Lack of  Transportation (Non-Medical): No  Physical Activity: Not on file  Stress: Not on file  Social Connections: Not on file   Allergies:  No Known Allergies  Labs:  Results for orders placed or performed during the hospital encounter of 03/07/23 (from the past 48 hour(s))  Comprehensive metabolic panel     Status: Abnormal   Collection Time: 03/07/23 11:26 AM  Result Value Ref Range   Sodium 136 135 - 145 mmol/L   Potassium 3.9 3.5 - 5.1 mmol/L   Chloride 105 98 - 111 mmol/L   CO2 24 22 - 32 mmol/L   Glucose, Bld 113 (H) 70 - 99 mg/dL    Comment: Glucose reference range applies only to samples taken after fasting for at least 8 hours.   BUN 6 6 - 20 mg/dL   Creatinine, Ser 8.41 0.44 - 1.00 mg/dL   Calcium 8.9 8.9 - 32.4 mg/dL   Total Protein 7.3 6.5 - 8.1 g/dL   Albumin 3.9 3.5 - 5.0 g/dL   AST 24 15 - 41 U/L   ALT 21 0 - 44 U/L   Alkaline Phosphatase 39 38 -  126 U/L   Total Bilirubin 0.4 0.3 - 1.2 mg/dL   GFR, Estimated >29 >56 mL/min    Comment: (NOTE) Calculated using the CKD-EPI Creatinine Equation (2021)    Anion gap 7 5 - 15    Comment: Performed at [facility], [address] [place], [place] [address]  Ethanol     Status: None   Collection Time: 03/07/23 11:26 AM  Result Value Ref Range   Alcohol, Ethyl (B) <10 <10 mg/dL    Comment: (NOTE) Lowest detectable limit for serum alcohol is 10 mg/dL.  For medical purposes only. Performed at [facility], [address] [place] [address] [place], [place] [address]   Salicylate level     Status: Abnormal   Collection Time: 03/07/23 11:26 AM  Result Value Ref Range   Salicylate Lvl <7.0 (L) 7.0 - 30.0 mg/dL    Comment: Performed at [facility], [address] [place], [place] [address]  CBC with Differential     Status: Abnormal   Collection Time: 03/07/23 11:26 AM  Result Value Ref Range   WBC 10.6 (H) 4.0 - 10.5 K/uL   RBC 4.02 3.87 - 5.11 MIL/uL   Hemoglobin 13.1 12.0 - 15.0 g/dL   HCT 52.8 41.3 -  24.4 %   MCV 96.3 80.0 - 100.0 fL   MCH 32.6 26.0 - 34.0 pg   MCHC 33.9 30.0 - 36.0 g/dL   RDW 01.0 27.2 - 53.6 %   Platelets 360 150 - 400 K/uL   nRBC 0.0 0.0 - 0.2 %   Neutrophils Relative % 51 %   Neutro Abs 5.5 1.7 - 7.7 K/uL   Lymphocytes Relative 34 %   Lymphs Abs 3.6 0.7 - 4.0 K/uL   Monocytes Relative 12 %   Monocytes Absolute 1.2 (H) 0.1 - 1.0 K/uL   Eosinophils Relative 2 %   Eosinophils Absolute 0.2 0.0 - 0.5 K/uL   Basophils Relative 1 %   Basophils Absolute 0.1 0.0 - 0.1 K/uL   Immature Granulocytes 0 %   Abs Immature Granulocytes 0.01 0.00 - 0.07 K/uL    Comment: Performed at [facility], [address] [place], [place] [address]  Acetaminophen level     Status: Abnormal   Collection Time: 03/07/23 11:26 AM  Result Value Ref Range   Acetaminophen (Tylenol), Serum <10 (L) 10 - 30 ug/mL    Comment: (NOTE) Therapeutic concentrations vary significantly. A range of 10-30 ug/mL  may be an effective concentration for many patients. However, some  are best treated at concentrations outside of this range. Acetaminophen concentrations >150 ug/mL at 4 hours after ingestion  and >50 ug/mL at 12 hours after ingestion are often associated with  toxic reactions.  Performed at [facility], [address] [place], [place] [address]   POC urine preg, ED     Status: None   Collection Time: 03/07/23 12:14 PM  Result Value Ref Range   Preg Test, Ur NEGATIVE NEGATIVE    Comment:        THE SENSITIVITY OF THIS METHODOLOGY IS >24 mIU/mL     Current Facility-Administered Medications  Medication Dose Route Frequency Provider Last Rate Last Admin   diphenhydrAMINE (BENADRYL) capsule 50 mg  50 mg Oral Q8H PRN Lauree Chandler, NP       Or   diphenhydrAMINE (BENADRYL) injection 50 mg  50 mg Intramuscular Q8H PRN Lauree Chandler, NP       haloperidol (HALDOL) tablet 5 mg  5 mg Oral Daily Lauree Chandler, NP       haloperidol (HALDOL) tablet 5 mg   5 mg Oral Q8H PRN Lauree Chandler, NP       Or   haloperidol lactate (HALDOL) injection 5 mg  5 mg Intramuscular Q8H PRN Lauree Chandler, NP       LORazepam (ATIVAN) tablet 2 mg  2 mg Oral Q8H PRN Lauree Chandler, NP       Or   LORazepam (ATIVAN) injection 2 mg  2 mg Intramuscular Q8H PRN Lauree Chandler, NP       Current Outpatient Medications  Medication Sig Dispense Refill   haloperidol (HALDOL) 5 MG tablet Take 1 tablet (5 mg total) by mouth at bedtime. 30 tablet 0   haloperidol decanoate (HALDOL DECANOATE) 100 MG/ML injection Inject 1 mL (100 mg total) into the muscle every 30 (thirty) days. 1 mL 1   nicotine (NICODERM CQ - DOSED IN MG/24 HOURS) 14 mg/24hr patch Place 1 patch (14 mg total) onto the skin daily. 14 patch 0   Musculoskeletal: Strength & Muscle Tone: within normal limits Gait & Station: normal Patient leans: N/A  Psychiatric Specialty Exam:  Presentation  General Appearance:  Appropriate for Environment  Eye Contact: Fair  Speech: Clear and Coherent  Speech Volume: Normal  Handedness: Right   Mood and Affect  Mood: -- ("tired")  Affect: Congruent; Other (comment) (mild mood lability)   Thought Process  Thought Processes: Coherent; Goal Directed; Linear  Descriptions of Associations:Intact  Orientation:Full (Time, Place and Person)  Thought Content:Logical  History of Schizophrenia/Schizoaffective disorder:Yes  Duration of Psychotic Symptoms: Not applicable Hallucinations:Hallucinations: None  Ideas of Reference:None  Suicidal Thoughts:Suicidal Thoughts: No  Homicidal Thoughts:Homicidal Thoughts: No   Sensorium  Memory: Immediate Fair  Judgment: Intact  Insight: Present   Executive Functions  Concentration: Fair  Attention Span: Fair  Recall: Fair  Fund of Knowledge: Fair  Language: Fair   Psychomotor Activity  Psychomotor Activity: Psychomotor Activity: Normal   Assets   Assets: Communication Skills; Desire for Improvement; Financial Resources/Insurance; Housing; Physical Health; Resilience; Social Support; Vocational/Educational   Sleep  Sleep: Sleep: Poor   Physical Exam: Physical Exam Constitutional:      General: She is not in acute distress.    Appearance: She is not ill-appearing, toxic-appearing or diaphoretic.  Eyes:     Comments: Bilateral conjunctival injection  Cardiovascular:     Rate and Rhythm: Tachycardia present.  Pulmonary:     Effort: Pulmonary effort is normal. No respiratory distress.  Neurological:     Mental Status: She is alert and oriented to person, place, and time.  Psychiatric:        Attention and Perception: Attention and perception normal.        Mood and Affect: Affect is labile.        Speech: Speech normal.        Behavior: Behavior normal. Behavior is cooperative.        Thought Content: Thought content normal.        Cognition and Memory: Cognition and memory normal.        Judgment: Judgment normal.    Review of Systems  Constitutional:  Negative for chills and fever.  Respiratory:  Negative for shortness of breath.   Cardiovascular:  Negative for chest pain and palpitations.  Gastrointestinal:  Negative for abdominal pain.  Neurological:  Negative for headaches.  Psychiatric/Behavioral:  The patient has insomnia.    Blood pressure Marland Kitchen)  162/104, pulse (!) 113, temperature 99 F (37.2 C), resp. rate 17, SpO2 100%. There is no height or weight on file to calculate BMI.  Treatment Plan Summary: Daily contact with patient to assess and evaluate symptoms and progress in treatment, Medication management, and Plan    -Start haldol 5mg  oral daily -Start haldol decanoate 100mg  IM every 30 days, first dose tomorrow 03/08/2023 -Start PRN agitation medications (benadryl 50mg  oral or IM every 8 hours PRN severe agitation; haldol 5mg  oral or IM every 8 hours agitation, severe agitation; ativan 2mg  oral or IM  every 8 hours PRN severe agitation) -Start melatonin 3mg  oral at bedtime prn sleep -Recommend overnight w/ reassessment in the AM -Pt is voluntary at this time  Disposition: Overnight w/ reassessment in the AM.   Lauree Chandler, NP 03/07/2023 12:45 PM

## 2023-03-07 NOTE — ED Notes (Signed)
Dressed out by [employer], EDT. Pt wearing hospital paper scrubs. Belongings labeled with name 1/1 including 1 pair stud earrings, cell phone, jeans, socks, underwear, bra, sweatshirt, long sleeve shirt.

## 2023-03-07 NOTE — ED Notes (Signed)
Reports she works night shift, unable to sleep during day. Pt states that they usually give her medication to help.

## 2023-03-07 NOTE — BH Assessment (Signed)
Comprehensive Clinical Assessment (CCA) Screening, Triage and Referral Note  03/07/2023 Jody Eaton [id number]  [facility], 42 year old female who presents to [facility] ED voluntarily for treatment. Per triage note, Pt arrives via POV. PT reports she has not had any sleep in at least 3 days. Pt AxOx4. Pt denies any si, hi, auditory or visual hallucinations. Pt arrives with family. Family states patient has done this in the past. Family states she has been working way too much.   During TTS assessment pt presents alert and oriented x 4, restless but cooperative, and mood-congruent with affect. The pt does not appear to be responding to internal or external stimuli. Neither is the pt presenting with any delusional thinking. Pt verified the information provided to triage RN.   Pt identifies her main complaint to be that she needs rest. Patient states she has not slept in 3 days due to her mind racing with loads of energy. 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 Patient states she lives at home with her 77 year old son in [place], [place]. Patient reports she has not been taking her medication for the past several months after being advised by the doctor at [facility] that she did not need to take her monthly injection. Patient says she was doing well while on it but followed the doctor's orders. Patient's presentation during the interview began with patient smiling inappropriately; however, after being told she was recommended for overnight observation, patient is reluctant to agree initially but says she will stay and receive treatment. Pt denies using any illicit substances and alcohol. Pt denies current SI/HI/AH/VH. Pt contracts for safety. Pt provided her sister, Missy as a collateral contact.    Missy [phone] was called during the interview. Missy reports patient was supposed to have  received her medication but is quite concerned and does not want patient to worsen which she states patient will decompensate rapidly and spiral out of control. Psych Team discussed the recommendation for pt to stay overnight with reassessment in the morning, starting her on oral Haldol today and start her back on the Haldol decanoate long-acting injectable tomorrow. Missy verbalized understanding.   Per Annice Pih, NP, pt is recommended for overnight observation to be reassessed in the morning.    Chief Complaint:  Chief Complaint  Patient presents with   Insomnia   Visit Diagnosis: Schizoaffective disorder, bipolar type  Patient Reported Information How did you hear about [place]? Family/Friend  What Is the Reason for Your Visit/Call Today? Patient states she needs some rest.  How Long Has This Been Causing You Problems? 1-6 months  What Do You Feel Would Help You the Most Today? Medication(s); Treatment for Depression or other mood problem   Have You Recently Had Any Thoughts About Hurting Yourself? No  Are You Planning to Commit Suicide/Harm Yourself At This time? No   Have you Recently Had Thoughts About Hurting Someone Jody Eaton? No  Are You Planning to Harm Someone at This Time? No  Explanation: n/a   Have You Used Any Alcohol or Drugs in the Past 24 Hours? No  How Long Ago Did You Use Drugs or Alcohol? No data recorded What Did You Use and How Much? n/a   Do You Currently Have a Therapist/Psychiatrist? Yes  Name of Therapist/Psychiatrist: [facility]   Have You Been Recently Discharged From Any Office Practice or  Programs? No  Explanation of Discharge From Practice/Program: n/a    CCA Screening Triage Referral Assessment Type of Contact: Face-to-Face  Telemedicine Service Delivery:   Is this Initial or Reassessment?   Date Telepsych consult ordered in [facility]:    Time Telepsych consult ordered in [facility]:    Location of Assessment: [facility] ED  Provider Location: [facility] ED     Collateral Involvement: Ellan Lambert [phone]   Does Patient Have a Court Appointed Legal Guardian? No data recorded Name and Contact of Legal Guardian: No data recorded If Minor and Not Living with Parent(s), Who has Custody? n/a  Is CPS involved or ever been involved? Never  Is APS involved or ever been involved? Never   Patient Determined To Be At Risk for Harm To Self or Others Based on Review of Patient Reported Information or Presenting Complaint? No  Method: No Plan  Availability of Means: No access or NA  Intent: Vague intent or NA  Notification Required: No data recorded Additional Information for Danger to Others Potential: -- (n/a)  Additional Comments for Danger to Others Potential: n/a  Are There Guns or Other Weapons in Your Home? No  Types of Guns/Weapons: n/a  Are These Weapons Safely Secured?                            -- (n/a)  Who Could Verify You Are Able To Have These Secured: n/a  Do You Have any Outstanding Charges, Pending Court Dates, Parole/Probation? None reported  Contacted To Inform of Risk of Harm To Self or Others: Other: Comment   Does Patient Present under Involuntary Commitment? No    [place] of Residence: [place]   Patient Currently Receiving the Following Services: Not Receiving Services   Determination of Need: Emergent (2 hours)   Options For Referral: ED Visit; Outpatient Therapy; Medication Management; Therapeutic Triage Services   Discharge Disposition:     Clerance Lav, Counselor, LCAS-A

## 2023-03-07 NOTE — ED Notes (Signed)
Hospital meal provided.  100% consumed, pt tolerated w/o complaints.  Waste discarded appropriately.   

## 2023-03-07 NOTE — ED Triage Notes (Signed)
Pt arrives via POV. PT reports she has not had any sleep in at least 3 days. Pt AxOx4. Pt denies any si, hi, auditory or visual hallucinations. Pt arrives with family. Family states patient has done this in the past. Family states she has been working way too much.

## 2023-03-07 NOTE — ED Provider Notes (Signed)
[facility] Provider Note    Event Date/Time   First MD Initiated Contact with Patient 03/07/23 1101     (approximate)   History   Insomnia   HPI  Jody Eaton is a 42 y.o. female   Past medical history of bipolar, schizophrenia, tobacco use, cannabis use, insomnia who presents to the emergency department with insomnia.  Has not had an any good sleep for the last 3 days.  Works night shift.  Denies suicidality, hallucinations, homicidality.  No drug or alcohol use.  No significant caffeine use.  She denies any recent illnesses, and has no other acute medical complaints.  She has been compliant with her monthly Depo Haldol shots.  Independent Historian contributed to assessment above:  She is here with her sister who corroborates information above.  Sister states that she just "shuts down" and acts abnormally when she lacks sleep.      Physical Exam   Triage Vital Signs: ED Triage Vitals  Encounter Vitals Group     BP 03/07/23 1032 (!) 162/104     Systolic BP Percentile --      Diastolic BP Percentile --      Pulse Rate 03/07/23 1032 (!) 113     Resp 03/07/23 1032 17     Temp 03/07/23 1032 99 F (37.2 C)     Temp src --      SpO2 03/07/23 1032 100 %     Weight --      Height --      Head Circumference --      Peak Flow --      Pain Score 03/07/23 1031 0     Pain Loc --      Pain Education --      Exclude from Growth Chart --     Most recent vital signs: Vitals:   03/07/23 1032  BP: (!) 162/104  Pulse: (!) 113  Resp: 17  Temp: 99 F (37.2 C)  SpO2: 100%    General: Awake, no distress.  CV:  Good peripheral perfusion.  Resp:  Normal effort.  Abd:  No distention.  Other:  Mildly tachycardic, hypertensive otherwise vital signs normal.  Awake alert cooperative.  Sometimes smiles at me inappropriately and will refuse to answer some questions.  Does not appear to be toxidromic   ED Results / Procedures / Treatments   Labs (all  labs ordered are listed, but only abnormal results are displayed) Labs Reviewed  COMPREHENSIVE METABOLIC PANEL - Abnormal; Notable for the following components:      Result Value   Glucose, Bld 113 (*)    All other components within normal limits  SALICYLATE LEVEL - Abnormal; Notable for the following components:   Salicylate Lvl <7.0 (*)    All other components within normal limits  CBC WITH DIFFERENTIAL/PLATELET - Abnormal; Notable for the following components:   WBC 10.6 (*)    Monocytes Absolute 1.2 (*)    All other components within normal limits  ACETAMINOPHEN LEVEL - Abnormal; Notable for the following components:   Acetaminophen (Tylenol), Serum <10 (*)    All other components within normal limits  ETHANOL  URINE DRUG SCREEN, QUALITATIVE ([facility] ONLY)  POC URINE PREG, ED     I ordered and reviewed the above labs they are notable for cell counts electrolytes largely unremarkable   PROCEDURES:  Critical Care performed: No  Procedures   MEDICATIONS ORDERED IN ED: Medications  haloperidol (HALDOL) tablet 5 mg (  5 mg Oral Given 03/07/23 1246)  diphenhydrAMINE (BENADRYL) capsule 50 mg (has no administration in time range)    Or  diphenhydrAMINE (BENADRYL) injection 50 mg (has no administration in time range)  LORazepam (ATIVAN) tablet 2 mg (has no administration in time range)    Or  LORazepam (ATIVAN) injection 2 mg (has no administration in time range)  haloperidol (HALDOL) tablet 5 mg (has no administration in time range)    Or  haloperidol lactate (HALDOL) injection 5 mg (has no administration in time range)  haloperidol decanoate (HALDOL DECANOATE) 100 MG/ML injection 100 mg (has no administration in time range)  melatonin tablet 2.5 mg (has no administration in time range)    IMPRESSION / MDM / ASSESSMENT AND PLAN / ED COURSE  I reviewed the triage vital signs and the nursing notes.                                Patient's presentation is most consistent  with acute presentation with potential threat to life or bodily function.  Differential diagnosis includes, but is not limited to, insomnia, acute decompensated psychiatric illness, substance-induced mood disorder   The patient is on the cardiac monitor to evaluate for evidence of arrhythmia and/or significant heart rate changes.  MDM:    Patient is here voluntarily with a history of bipolar schizophrenia and insomnia with chief complaint of insomnia.  No suicidality homicidality or hallucinations and she appears to be cooperative and not acutely psychotic at this time so I do not see a reason to IVC her.  Will get basic labs, toxicologic labs, and psychiatric evaluation.       FINAL CLINICAL IMPRESSION(S) / ED DIAGNOSES   Final diagnoses:  Insomnia, unspecified type     Rx / DC Orders   ED Discharge Orders     None        Note:  This document was prepared using Dragon voice recognition software and may include unintentional dictation errors.    Pilar Jarvis, MD 03/07/23 1536

## 2023-03-08 MED ORDER — HALOPERIDOL 5 MG PO TABS: 5.0000 mg | ORAL_TABLET | Freq: Every day | ORAL | 0 refills | Status: AC

## 2023-03-08 MED ORDER — MELATONIN 5 MG PO TABS: 2.5000 mg | ORAL_TABLET | Freq: Every evening | ORAL | Status: DC | PRN

## 2023-03-08 NOTE — ED Notes (Signed)
Staff administered LAI w/o incident.

## 2023-03-08 NOTE — Consult Note (Signed)
[facility] Face-to-Face Psychiatry Consult   Reason for Consult:  Admit Referring Physician:  Dr. Pilar Jarvis Patient Identification: Jody Eaton MRN:  [id number] Principal Diagnosis: Schizoaffective disorder, bipolar type (HCC) Diagnosis:  Principal Problem:   Schizoaffective disorder, bipolar type (HCC)  Total Time spent with patient, including time reviewing pt's chart and speaking with pt's sister: 30 minutes  Subjective:    Jody Eaton is a 42 y.o. female patient w/ history of schizophrenia, bipolar disorder, admitted to [facility], per triage note: Pt arrives via POV. PT reports she has not had any sleep in at least 3 days. Pt AxOx4. Pt denies any si, hi, auditory or visual hallucinations. Pt arrives with family. Family states patient has done this in the past. Family states she has been working way too much.   HPI:   Pt chart reviewed and seen on rounds. No significant overnight events and no agitation PRN required. Pt calm, cooperative, pleasant on assessment. Mood lability appears improved compared to yesterday and pt appears calmer. States "I feel better" today. Reports improvement in sleep compared to prior to admission although felt she could have slept better if she were at home. States hospital bed was not comfortable. She states she will follow up with [facility] for continued medication management once discharged. She denies suicidal, homicidal ideations. She denies auditory visual hallucinations or paranoia. She gives verbal consent for her sister to be called.  Spoke w/ pt's sister, Jody Eaton, at [phone]. Reviewed pt's progress and presentation today. She denies safety concerns with discharge. States she can come pick pt up.  Past Psychiatric History: bipolar disorder, schizophrenia  Risk to Self: Denies suicidal ideations Risk to Others: Denies homicidal ideations Prior Inpatient Therapy: Yes Prior Outpatient Therapy: Yes  Past Medical History:  Past Medical  History:  Diagnosis Date   Bipolar 1 disorder (HCC)    Schizophrenia (HCC)    History reviewed. No pertinent surgical history. Family History: History reviewed. No pertinent family history. Family Psychiatric  History: None reported Social History:  Social History   Substance and Sexual Activity  Alcohol Use Yes   Alcohol/week: 17.0 standard drinks of alcohol   Types: 7 Glasses of wine, 10 Shots of liquor per week     Social History   Substance and Sexual Activity  Drug Use No    Social History   Socioeconomic History   Marital status: Single    Spouse name: Not on file   Number of children: Not on file   Years of education: Not on file   Highest education level: Not on file  Occupational History   Not on file  Tobacco Use   Smoking status: Light Smoker    Current packs/day: 0.25    Average packs/day: 0.3 packs/day for 1 year (0.3 ttl pk-yrs)    Types: Cigarettes   Smokeless tobacco: Never  Substance and Sexual Activity   Alcohol use: Yes    Alcohol/week: 17.0 standard drinks of alcohol    Types: 7 Glasses of wine, 10 Shots of liquor per week   Drug use: No   Sexual activity: Yes    Birth control/protection: Condom  Other Topics Concern   Not on file  Social History Narrative   Not on file   Social Determinants of Health   Financial Resource Strain: Not on file  Food Insecurity: No Food Insecurity (07/11/2022)   Hunger Vital Sign    Worried About Running Out of Food in the Last Year: Never true  Ran Out of Food in the Last Year: Never true  Transportation Needs: No Transportation Needs (07/11/2022)   PRAPARE - Administrator, Civil Service (Medical): No    Lack of Transportation (Non-Medical): No  Physical Activity: Not on file  Stress: Not on file  Social Connections: Not on file   Allergies:  No Known Allergies  Labs:  Results for orders placed or performed during the hospital encounter of 03/07/23 (from the past 48 hour(s))  Comprehensive  metabolic panel     Status: Abnormal   Collection Time: 03/07/23 11:26 AM  Result Value Ref Range   Sodium 136 135 - 145 mmol/L   Potassium 3.9 3.5 - 5.1 mmol/L   Chloride 105 98 - 111 mmol/L   CO2 24 22 - 32 mmol/L   Glucose, Bld 113 (H) 70 - 99 mg/dL    Comment: Glucose reference range applies only to samples taken after fasting for at least 8 hours.   BUN 6 6 - 20 mg/dL   Creatinine, Ser 1.61 0.44 - 1.00 mg/dL   Calcium 8.9 8.9 - 09.6 mg/dL   Total Protein 7.3 6.5 - 8.1 g/dL   Albumin 3.9 3.5 - 5.0 g/dL   AST 24 15 - 41 U/L   ALT 21 0 - 44 U/L   Alkaline Phosphatase 39 38 - 126 U/L   Total Bilirubin 0.4 0.3 - 1.2 mg/dL   GFR, Estimated >04 >54 mL/min    Comment: (NOTE) Calculated using the CKD-EPI Creatinine Equation (2021)    Anion gap 7 5 - 15    Comment: Performed at [facility], [address] [place], [place] [address]  Ethanol     Status: None   Collection Time: 03/07/23 11:26 AM  Result Value Ref Range   Alcohol, Ethyl (B) <10 <10 mg/dL    Comment: (NOTE) Lowest detectable limit for serum alcohol is 10 mg/dL.  For medical purposes only. Performed at [facility], [address] [place], [place] [address]   Salicylate level     Status: Abnormal   Collection Time: 03/07/23 11:26 AM  Result Value Ref Range   Salicylate Lvl <7.0 (L) 7.0 - 30.0 mg/dL    Comment: Performed at [facility], [address] [place], [place] [address]  CBC with Differential     Status: Abnormal   Collection Time: 03/07/23 11:26 AM  Result Value Ref Range   WBC 10.6 (H) 4.0 - 10.5 K/uL   RBC 4.02 3.87 - 5.11 MIL/uL   Hemoglobin 13.1 12.0 - 15.0 g/dL   HCT 13.0 86.5 - 78.4 %   MCV 96.3 80.0 - 100.0 fL   MCH 32.6 26.0 - 34.0 pg   MCHC 33.9 30.0 - 36.0 g/dL   RDW 69.6 29.5 - 28.4 %   Platelets 360 150 - 400 K/uL   nRBC 0.0 0.0 - 0.2 %   Neutrophils Relative % 51 %   Neutro Abs 5.5 1.7 - 7.7 K/uL   Lymphocytes Relative 34 %   Lymphs Abs 3.6 0.7 -  4.0 K/uL   Monocytes Relative 12 %   Monocytes Absolute 1.2 (H) 0.1 - 1.0 K/uL   Eosinophils Relative 2 %   Eosinophils Absolute 0.2 0.0 - 0.5 K/uL   Basophils Relative 1 %   Basophils Absolute 0.1 0.0 - 0.1 K/uL   Immature Granulocytes 0 %   Abs Immature Granulocytes 0.01 0.00 - 0.07 K/uL    Comment: Performed at [facility], [address] [place], [place] [address]  Acetaminophen level     Status: Abnormal   Collection Time: 03/07/23 11:26 AM  Result Value Ref Range   Acetaminophen (Tylenol), Serum <10 (L) 10 - 30 ug/mL    Comment: (NOTE) Therapeutic concentrations vary significantly. A range of 10-30 ug/mL  may be an effective concentration for many patients. However, some  are best treated at concentrations outside of this range. Acetaminophen concentrations >150 ug/mL at 4 hours after ingestion  and >50 ug/mL at 12 hours after ingestion are often associated with  toxic reactions.  Performed at [facility], [address] [place], [place] [address]   POC urine preg, ED     Status: None   Collection Time: 03/07/23 12:14 PM  Result Value Ref Range   Preg Test, Ur NEGATIVE NEGATIVE    Comment:        THE SENSITIVITY OF THIS METHODOLOGY IS >24 mIU/mL   Urine Drug Screen, Qualitative     Status: None   Collection Time: 03/07/23 12:20 PM  Result Value Ref Range   Tricyclic, Ur Screen NONE DETECTED NONE DETECTED   Amphetamines, Ur Screen NONE DETECTED NONE DETECTED   MDMA (Ecstasy)Ur Screen NONE DETECTED NONE DETECTED   Cocaine Metabolite,Ur Welcome NONE DETECTED NONE DETECTED   Opiate, Ur Screen NONE DETECTED NONE DETECTED   Phencyclidine (PCP) Ur S NONE DETECTED NONE DETECTED   Cannabinoid 50 Ng, Ur  NONE DETECTED NONE DETECTED   Barbiturates, Ur Screen NONE DETECTED NONE DETECTED   Benzodiazepine, Ur Scrn NONE DETECTED NONE DETECTED   Methadone Scn, Ur NONE DETECTED NONE DETECTED    Comment: (NOTE) Tricyclics + metabolites, urine    Cutoff 1000  ng/mL Amphetamines + metabolites, urine  Cutoff 1000 ng/mL MDMA (Ecstasy), urine              Cutoff 500 ng/mL Cocaine Metabolite, urine          Cutoff 300 ng/mL Opiate + metabolites, urine        Cutoff 300 ng/mL Phencyclidine (PCP), urine         Cutoff 25 ng/mL Cannabinoid, urine                 Cutoff 50 ng/mL Barbiturates + metabolites, urine  Cutoff 200 ng/mL Benzodiazepine, urine              Cutoff 200 ng/mL Methadone, urine                   Cutoff 300 ng/mL  The urine drug screen provides only a preliminary, unconfirmed analytical test result and should not be used for non-medical purposes. Clinical consideration and professional judgment should be applied to any positive drug screen result due to possible interfering substances. A more specific alternate chemical method must be used in order to obtain a confirmed analytical result. Gas chromatography / mass spectrometry (GC/MS) is the preferred confirm atory method. Performed at [facility], [address], [place], [place] [address]     Current Facility-Administered Medications  Medication Dose Route Frequency Provider Last Rate Last Admin   diphenhydrAMINE (BENADRYL) capsule 50 mg  50 mg Oral Q8H PRN Lauree Chandler, NP       Or   diphenhydrAMINE (BENADRYL) injection 50 mg  50 mg Intramuscular Q8H PRN Lauree Chandler, NP       haloperidol (HALDOL) tablet 5 mg  5 mg Oral Daily Lauree Chandler, NP   5 mg at 03/08/23 [phone]  haloperidol (HALDOL) tablet 5 mg  5 mg Oral Q8H PRN Lauree Chandler, NP       Or   haloperidol lactate (HALDOL) injection 5 mg  5 mg Intramuscular Q8H PRN Lauree Chandler, NP       haloperidol decanoate (HALDOL DECANOATE) 100 MG/ML injection 100 mg  100 mg Intramuscular Q30 days Lauree Chandler, NP   100 mg at 03/08/23 0856   LORazepam (ATIVAN) tablet 2 mg  2 mg Oral Q8H PRN Lauree Chandler, NP       Or   LORazepam (ATIVAN) injection 2 mg  2 mg Intramuscular Q8H  PRN Lauree Chandler, NP       melatonin tablet 2.5 mg  2.5 mg Oral QHS PRN Lauree Chandler, NP       Current Outpatient Medications  Medication Sig Dispense Refill   [START ON 03/09/2023] haloperidol (HALDOL) 5 MG tablet Take 1 tablet (5 mg total) by mouth daily. 30 tablet 0   haloperidol decanoate (HALDOL DECANOATE) 100 MG/ML injection Inject 1 mL (100 mg total) into the muscle every 30 (thirty) days. (Patient not taking: Reported on 03/07/2023) 1 mL 1   melatonin 5 MG TABS Take 0.5 tablets (2.5 mg total) by mouth at bedtime as needed.      Musculoskeletal: Strength & Muscle Tone: within normal limits Gait & Station: normal Patient leans: N/A            Psychiatric Specialty Exam:  Presentation  General Appearance:  Appropriate for Environment  Eye Contact: Fair  Speech: Clear and Coherent; Normal Rate  Speech Volume: Normal  Handedness: Right   Mood and Affect  Mood: -- ("feel better")  Affect: Appropriate; Full Range   Thought Process  Thought Processes: Coherent; Goal Directed; Linear  Descriptions of Associations:Intact  Orientation:Full (Time, Place and Person)  Thought Content:Logical  History of Schizophrenia/Schizoaffective disorder:Yes  Duration of Psychotic Symptoms:Not applicable Hallucinations:Hallucinations: None  Ideas of Reference:None  Suicidal Thoughts:Suicidal Thoughts: No  Homicidal Thoughts:Homicidal Thoughts: No   Sensorium  Memory: Immediate Fair  Judgment: Intact  Insight: Present   Executive Functions  Concentration: Fair  Attention Span: Fair  Recall: Fair  Fund of Knowledge: Fair  Language: Fair   Psychomotor Activity  Psychomotor Activity: Psychomotor Activity: Normal   Assets  Assets: Communication Skills; Desire for Improvement; Financial Resources/Insurance; Housing; Physical Health; Resilience; Social Support   Sleep  Sleep: Sleep: -- (improved)   Physical  Exam: Physical Exam Constitutional:      General: She is not in acute distress.    Appearance: She is not ill-appearing, toxic-appearing or diaphoretic.  Eyes:     General: No scleral icterus.    Comments: Bilateral conjunctival injection improved today  Cardiovascular:     Rate and Rhythm: Normal rate.  Pulmonary:     Effort: Pulmonary effort is normal. No respiratory distress.  Neurological:     Mental Status: She is alert and oriented to person, place, and time.  Psychiatric:        Attention and Perception: Attention and perception normal.        Mood and Affect: Mood and affect normal.        Speech: Speech normal.        Behavior: Behavior normal. Behavior is cooperative.        Thought Content: Thought content normal.        Cognition and Memory: Cognition and memory normal.        Judgment: Judgment normal.  Review of Systems  Constitutional:  Negative for chills and fever.  Respiratory:  Negative for shortness of breath.   Cardiovascular:  Negative for chest pain and palpitations.  Gastrointestinal:  Negative for abdominal pain.  Neurological:  Negative for headaches.  Psychiatric/Behavioral:  The patient has insomnia.   Insomnia improving Blood pressure (!) 166/87, pulse 100, temperature 98.2 F (36.8 C), temperature source Oral, resp. rate 20, SpO2 99%. There is no height or weight on file to calculate BMI.  Treatment Plan Summary: Daily contact with patient to assess and evaluate symptoms and progress in treatment, Medication management, and Plan    42 y/o female w/ history of schizophrenia, bipolar disorder admitted due to concerns about sleep. Has not taken psychotropic medications, including her LAI, in several months. In the past has become manic/psychotic without sleep. Collateral had expressed concerns about psychiatric decompensation without intervention. Oral haldol started yesterday and haldol dec started today. Pt reports improvement in sleep and "I feel  better" today. Mood lability improved today. Appears calmer. She is to follow up with [facility] for continued psychiatric management.  Disposition: No evidence of imminent risk to self or others at present.   Patient does not meet criteria for psychiatric inpatient admission. Supportive therapy provided about ongoing stressors. Discussed crisis plan, support from social network, calling 911, coming to the Emergency Department, and calling Suicide Hotline.  Lauree Chandler, NP 03/08/2023 9:22 AM

## 2023-03-08 NOTE — ED Provider Notes (Signed)
Emergency Medicine Observation Re-evaluation Note  Jody Eaton is a 42 y.o. female, seen on rounds today.  Pt initially presented to the ED for complaints of Insomnia Currently, the patient is resting comfortably.  Physical Exam  BP (!) 156/108 (BP Location: Right Arm)   Pulse (!) 107   Temp 98.7 F (37.1 C) (Oral)   Resp 20   SpO2 99%  Physical Exam Patient appears well, no acute distress, normal WOB    ED Course / MDM  EKG:EKG Interpretation Date/Time:  Tuesday March 07 2023 13:07:33 EDT Ventricular Rate:  103 PR Interval:  142 QRS Duration:  76 QT Interval:  340 QTC Calculation: 445 R Axis:   60  Text Interpretation: Sinus tachycardia Possible Left atrial enlargement Borderline ECG When compared with ECG of 06-Feb-2018 18:28, ST no longer depressed in Inferior leads Nonspecific T wave abnormality has replaced inverted T waves in Inferior leads Confirmed by UNCONFIRMED, DOCTOR ([id number]), editor Lonell Face [id number]) on 03/08/2023 7:10:54 AM  I have reviewed the labs performed to date as well as medications administered while in observation.  Recent changes in the last 24 hours include none.  Plan  Current plan is for reassessment by psychiatry.    Chesley Noon, MD 03/08/23 [phone]

## 2023-03-08 NOTE — ED Notes (Signed)
Vol / overnight with reassessment this am

## 2023-03-08 NOTE — ED Notes (Signed)
During nursing assessment Ms Woody was A/Ox 4 .  She  stated that she is not currently have thoughts or feelings of SI/HI.  Ms Dick reported that she is not currently having auditory or visual hallucinations.  Pt affect is congruent with situation , eye contact is good , speech is of normal rate and volume  with appropriate verbiage noted.  Staff addressed any feelings or concerns that have been brought up.  Medications were administered as ordered. Continue to monitor patient as ordered for any changes in behaviors and for continued safety.

## 2023-03-08 NOTE — ED Notes (Signed)
Pt is A/Ox 4, Ms Jody Eaton declines any SI/HI stated that she is not having A/V hallucinations.  Discharge instructions reviewed with Pt and her sister, they verbalized understanding.  All Belongings accounted for and returned to PT.  Pt left ambulatory via private vehicle

## 2023-08-04 ENCOUNTER — Emergency Department
Admission: EM | Admit: 2023-08-04 | Discharge: 2023-08-06 | Disposition: A | Discharge status: Home / Self Care | Payer: MEDICAID | Attending: Emergency Medicine | Admitting: Emergency Medicine

## 2023-08-04 ENCOUNTER — Other Ambulatory Visit: Payer: Self-pay

## 2023-08-04 DIAGNOSIS — G4700 Insomnia, unspecified: Secondary | ICD-10-CM

## 2023-08-04 DIAGNOSIS — F29 Unspecified psychosis not due to a substance or known physiological condition: Secondary | ICD-10-CM

## 2023-08-04 DIAGNOSIS — F319 Bipolar disorder, unspecified: Secondary | ICD-10-CM | POA: Diagnosis not present

## 2023-08-04 DIAGNOSIS — F310 Bipolar disorder, current episode hypomanic: Secondary | ICD-10-CM | POA: Diagnosis present

## 2023-08-04 DIAGNOSIS — Z720 Tobacco use: Secondary | ICD-10-CM | POA: Diagnosis not present

## 2023-08-04 DIAGNOSIS — R451 Restlessness and agitation: Secondary | ICD-10-CM

## 2023-08-04 LAB — COMPREHENSIVE METABOLIC PANEL WITH GFR
ALT: 22 U/L (ref 0–44)
AST: 33 U/L (ref 15–41)
Albumin: 4.1 g/dL (ref 3.5–5.0)
Alkaline Phosphatase: 45 U/L (ref 38–126)
Anion gap: 13 (ref 5–15)
BUN: <5 mg/dL — ABNORMAL LOW (ref 6–20)
CO2: 23 mmol/L (ref 22–32)
Calcium: 9.2 mg/dL (ref 8.9–10.3)
Chloride: 105 mmol/L (ref 98–111)
Creatinine, Ser: 0.99 mg/dL (ref 0.44–1.00)
GFR, Estimated: >60 mL/min (ref >60)
Glucose, Bld: 120 mg/dL — ABNORMAL HIGH (ref 70–99)
Potassium: 3.2 mmol/L — ABNORMAL LOW (ref 3.5–5.1)
Sodium: 141 mmol/L (ref 135–145)
Total Bilirubin: 0.5 mg/dL (ref 0.0–1.2)
Total Protein: 7.6 g/dL (ref 6.5–8.1)

## 2023-08-04 LAB — CBC
HCT: 40.5 % (ref 36.0–46.0)
Hemoglobin: 13.7 g/dL (ref 12.0–15.0)
MCH: 32.5 pg (ref 26.0–34.0)
MCHC: 33.8 g/dL (ref 30.0–36.0)
MCV: 96.2 fL (ref 80.0–100.0)
Platelets: 410 10*3/uL — ABNORMAL HIGH (ref 150–400)
RBC: 4.21 MIL/uL (ref 3.87–5.11)
RDW: 13.2 % (ref 11.5–15.5)
WBC: 12.2 10*3/uL — ABNORMAL HIGH (ref 4.0–10.5)
nRBC: 0 % (ref 0.0–0.2)

## 2023-08-04 LAB — ACETAMINOPHEN LEVEL: Acetaminophen (Tylenol), Serum: <10 ug/mL — ABNORMAL LOW (ref 10–30)

## 2023-08-04 LAB — ETHANOL: Alcohol, Ethyl (B): 111 mg/dL — ABNORMAL HIGH (ref <10)

## 2023-08-04 LAB — SALICYLATE LEVEL: Salicylate Lvl: <7 mg/dL — ABNORMAL LOW (ref 7.0–30.0)

## 2023-08-04 MED ORDER — LORAZEPAM 2 MG/ML IJ SOLN: 0.0000 mg | Freq: Two times a day (BID) | INTRAMUSCULAR | Status: DC

## 2023-08-04 MED ORDER — THIAMINE MONONITRATE 100 MG PO TABS
100.0000 mg | ORAL_TABLET | Freq: Every day | ORAL | Status: DC
  Administered 2023-08-04 – 2023-08-06 (×3): 100 mg via ORAL
  Filled 2023-08-04 (×3): qty 1

## 2023-08-04 MED ORDER — LORAZEPAM 2 MG PO TABS
0.0000 mg | ORAL_TABLET | Freq: Four times a day (QID) | ORAL | Status: AC
  Administered 2023-08-04: 1 mg via ORAL
  Administered 2023-08-05: 2 mg via ORAL
  Filled 2023-08-04 (×2): qty 1

## 2023-08-04 MED ORDER — MELATONIN 5 MG PO TABS
2.5000 mg | ORAL_TABLET | Freq: Every day | ORAL | Status: DC
  Administered 2023-08-04 – 2023-08-05 (×2): 2.5 mg via ORAL
  Filled 2023-08-04 (×3): qty 1

## 2023-08-04 MED ORDER — OLANZAPINE 5 MG PO TBDP
5.0000 mg | ORAL_TABLET | Freq: Once | ORAL | Status: AC
  Administered 2023-08-04: 5 mg via ORAL
  Filled 2023-08-04: qty 1

## 2023-08-04 MED ORDER — LORAZEPAM 2 MG PO TABS: 0.0000 mg | ORAL_TABLET | Freq: Two times a day (BID) | ORAL | Status: DC

## 2023-08-04 MED ORDER — DROPERIDOL 2.5 MG/ML IJ SOLN
5.0000 mg | Freq: Once | INTRAMUSCULAR | Status: AC
  Administered 2023-08-04: 5 mg via INTRAMUSCULAR
  Filled 2023-08-04: qty 2

## 2023-08-04 MED ORDER — HALOPERIDOL 5 MG PO TABS
5.0000 mg | ORAL_TABLET | Freq: Every day | ORAL | Status: DC
  Administered 2023-08-04 – 2023-08-05 (×2): 5 mg via ORAL
  Filled 2023-08-04 (×2): qty 1

## 2023-08-04 MED ORDER — THIAMINE HCL 100 MG/ML IJ SOLN
100.0000 mg | Freq: Every day | INTRAMUSCULAR | Status: DC
  Filled 2023-08-04: qty 1

## 2023-08-04 MED ORDER — LORAZEPAM 2 MG/ML IJ SOLN: 0.0000 mg | Freq: Four times a day (QID) | INTRAMUSCULAR | Status: AC

## 2023-08-04 MED ORDER — HYDROXYZINE HCL 25 MG PO TABS
25.0000 mg | ORAL_TABLET | Freq: Three times a day (TID) | ORAL | Status: DC | PRN
Start: 2023-08-04 — End: 2023-08-07
  Administered 2023-08-05: 25 mg via ORAL
  Filled 2023-08-04: qty 1

## 2023-08-04 NOTE — BH Assessment (Signed)
 Comprehensive Clinical Assessment (CCA) Note  08/04/2023 Jody Eaton [id number]  Chief Complaint:  Chief Complaint  Patient presents with   Psychiatric Evaluation   Visit Diagnosis: Schizoaffective Disorder, Bipolar Type   Jody Eaton is a 43 year old female who presents to the ER due to her sister having concerns about her not sleeping. Per the patient, she hasn't slept in several days. She acknowledges she has a history of mental illness and take her medications as prescribed. She was unable to share her complete list of her medications and prescriber/provider. Per the report of the patient's sister, when she doesn't sleep, it's an indicator she isn't taking her medications. She has had an increase in energy, which was very noticeable when she got off work today. The sister wanted to take to her outpatient provider but she knew they would be closed for the weekend before they were able to get there. Prior to the Psych team seeing the patient, she received medications and she had calm down.  During the interview, she was calm, cooperative and pleasant. She was a poor historian regarding her mental health history, treatment and provider. She admits to the use of CBD and alcohol.  Collateral information from the patient's sister was obtained by Psych NP.  CCA Screening, Triage and Referral (STR)  Patient Reported Information How did you hear about [place]? Family/Friend  What Is the Reason for Your Visit/Call Today? Patient brought to the ER via her sister, due to her not sleeping and history of being manic.  How Long Has This Been Causing You Problems? 1 wk - 1 month  What Do You Feel Would Help You the Most Today? Treatment for Depression or other mood problem; Alcohol or Drug Use Treatment   Have You Recently Had Any Thoughts About Hurting Yourself? No  Are You Planning to Commit Suicide/Harm Yourself At This time? No   Flowsheet Row ED from 08/04/2023 in [facility]  Emergency Department at [facility] ED from 03/07/2023 in [facility] Emergency Department at [facility] ED from 08/21/2022 in [facility] Emergency Department at [facility]  C-SSRS RISK CATEGORY No Risk No Risk No Risk       Have you Recently Had Thoughts About Hurting Someone Jody Eaton? No  Are You Planning to Harm Someone at This Time? No  Explanation: n/a   Have You Used Any Alcohol or Drugs in the Past 24 Hours? Yes  How Long Ago Did You Use Drugs or Alcohol? Alcohol and CBD  What Did You Use and How Much? Unable to quantify   Do You Currently Have a Therapist/Psychiatrist? Yes  Name of Therapist/Psychiatrist: Name of Therapist/Psychiatrist: Unable to remember the name.   Have You Been Recently Discharged From Any Office Practice or Programs? No  Explanation of Discharge From Practice/Program: n/a     CCA Screening Triage Referral Assessment Type of Contact: Face-to-Face  Telemedicine Service Delivery:   Is this Initial or Reassessment?   Date Telepsych consult ordered in [facility]:    Time Telepsych consult ordered in [facility]:    Location of Assessment: [facility] ED  Provider Location: [facility] ED   Collateral Involvement: Patient's sister   Does Patient Have a Automotive engineer Guardian? No  Legal Guardian Contact Information: Patient's sister  Copy of Legal Guardianship Form: -- (n/a)  Legal Guardian Notified of Arrival: -- (n/a)  Legal Guardian Notified of Pending Discharge: -- (n/a)  If Minor and Not Living with Parent(s), Who has Custody? n/a  Is CPS involved  or ever been involved? Never  Is APS involved or ever been involved? Never   Patient Determined To Be At Risk for Harm To Self or Others Based on Review of Patient Reported Information or Presenting Complaint? No  Method: No Plan  Availability of Means: No access or NA  Intent: Vague intent or NA  Notification Required: No data recorded Additional Information for Danger to Others  Potential: -- (n/a)  Additional Comments for Danger to Others Potential: n/a  Are There Guns or Other Weapons in Your Home? No  Types of Guns/Weapons: n/a  Are These Weapons Safely Secured?                            -- (n/a)  Who Could Verify You Are Able To Have These Secured: n/a  Do You Have any Outstanding Charges, Pending Court Dates, Parole/Probation? None reported  Contacted To Inform of Risk of Harm To Self or Others: Other: Comment    Does Patient Present under Involuntary Commitment? Yes    [place] of Residence: [place]   Patient Currently Receiving the Following Services: Medication Management   Determination of Need: Emergent (2 hours)   Options For Referral: ED Visit     CCA Biopsychosocial Patient Reported Schizophrenia/Schizoaffective Diagnosis in Past: Yes   Strengths: Work full-time, have stable housing and have support system.   Mental Health Symptoms Depression:  Difficulty Concentrating; Change in energy/activity   Duration of Depressive symptoms: Duration of Depressive Symptoms: Less than two weeks   Mania:  Increased Energy; Racing thoughts; Change in energy/activity   Anxiety:   Restlessness   Psychosis:  None   Duration of Psychotic symptoms:    Trauma:  N/A   Obsessions:  N/A   Compulsions:  N/A   Inattention:  N/A   Hyperactivity/Impulsivity:  N/A   Oppositional/Defiant Behaviors:  N/A   Emotional Irregularity:  N/A   Other Mood/Personality Symptoms:  No data recorded   Mental Status Exam Appearance and self-care  Stature:  Average   Weight:  Average weight   Clothing:  Neat/clean; Age-appropriate   Grooming:  Normal   Cosmetic use:  None   Posture/gait:  Normal   Motor activity:  -- (Within normal range)   Sensorium  Attention:  Normal   Concentration:  Normal   Orientation:  X5   Recall/memory:  Normal   Affect and Mood  Affect:  Anxious; Appropriate; Full Range   Mood:  Anxious    Relating  Eye contact:  Normal   Facial expression:  Anxious; Responsive   Attitude toward examiner:  Cooperative   Thought and Language  Speech flow: Clear and Coherent; Normal   Thought content:  Appropriate to Mood and Circumstances   Preoccupation:  None   Hallucinations:  None   Organization:  Coherent; Intact   [employer] of Knowledge:  Fair   Intelligence:  Average   Abstraction:  Functional   Judgement:  Fair   Dance movement psychotherapist:  Adequate   Insight:  Fair   Decision Making:  Normal; Impulsive (Per the sister)   Social Functioning  Social Maturity:  Impulsive (Per the sister)   Social Judgement:  Heedless; "Street Smart"   Stress  Stressors:  Other (Comment) (Not sleeping)   Coping Ability:  Normal   Skill Deficits:  None   Supports:  Family; Friends/Service system     Religion: Religion/Spirituality Are You A Religious Person?: Yes What is Your Religious  Affiliation?: Environmental consultant: Leisure / Recreation Do You Have Hobbies?: No  Exercise/Diet: Exercise/Diet Do You Exercise?: No Have You Gained or Lost A Significant Amount of Weight in the Past Six Months?: No Do You Follow a Special Diet?: No Do You Have Any Trouble Sleeping?: Yes Explanation of Sleeping Difficulties: State she hasn't slept in a few days.   CCA Employment/Education Employment/Work Situation: Employment / Work Situation Employment Situation: Employed Patient's Job has Been Impacted by Current Illness: No Has Patient ever Been in Equities trader?: No  Education: Education Is Patient Currently Attending School?: No Last Grade Completed: 11 Did You Attend College?: Yes What Type of College Degree Do you Have?: [employer] for a trade. Did You Have An Individualized Education Program (IIEP): No Did You Have Any Difficulty At School?: No Patient's Education Has Been Impacted by Current Illness: No   CCA Family/Childhood  History Family and Relationship History: Family history Marital status: Single Does patient have children?: Yes How many children?: 3 How is patient's relationship with their children?: States it's good  Childhood History:  Childhood History By whom was/is the patient raised?: Mother Did patient suffer any verbal/emotional/physical/sexual abuse as a child?: No Did patient suffer from severe childhood neglect?: No Has patient ever been sexually abused/assaulted/raped as an adolescent or adult?: No Was the patient ever a victim of a crime or a disaster?: No Witnessed domestic violence?: No Has patient been affected by domestic violence as an adult?: No   CCA Substance Use Alcohol/Drug Use: Alcohol / Drug Use Pain Medications: See MAR Prescriptions: See MAR Over the Counter: See MAR History of alcohol / drug use?: Yes Longest period of sobriety (when/how long): Unable to quantify Substance #1 Name of Substance 1: Alcohol Substance #2 Name of Substance 2: CBD    ASAM's:  Six Dimensions of Multidimensional Assessment  Dimension 1:  Acute Intoxication and/or Withdrawal Potential:      Dimension 2:  Biomedical Conditions and Complications:      Dimension 3:  Emotional, Behavioral, or Cognitive Conditions and Complications:     Dimension 4:  Readiness to Change:     Dimension 5:  Relapse, Continued use, or Continued Problem Potential:     Dimension 6:  Recovery/Living Environment:     ASAM Severity Score:    ASAM Recommended Level of Treatment:     Substance use Disorder (SUD)    Recommendations for Services/Supports/Treatments:    Disposition Recommendation per psychiatric provider: Observe overnight and reassess tomorrow (08/05/2023).   DSM5 Diagnoses: Patient Active Problem List   Diagnosis Date Noted   Agitation 08/04/2023   Schizoaffective disorder, bipolar type (HCC) 03/07/2023   Insomnia 08/22/2022   Schizophrenia, acute undifferentiated (HCC) 07/11/2022    Bipolar 1 disorder, depressed, severe (HCC) 07/10/2022   Noncompliance 07/03/2017   Cannabis abuse 07/03/2017   Tobacco use disorder 05/02/2016   Bipolar I disorder (HCC) 05/02/2016    Referrals to Alternative Service(s): Referred to Alternative Service(s):   Place:   Date:   Time:    Referred to Alternative Service(s):   Place:   Date:   Time:    Referred to Alternative Service(s):   Place:   Date:   Time:    Referred to Alternative Service(s):   Place:   Date:   Time:     Lilyan Gilford MS, LCAS, [facility], [facility] Therapeutic Triage Specialist 08/04/2023 5:53 PM

## 2023-08-04 NOTE — ED Notes (Signed)
 Pt repetitively walking out of her room and towards the exit. Pt has to be redirected multiple times by security and staff both. Pt inappropriately laughing and smiling. EDP notified.

## 2023-08-04 NOTE — ED Triage Notes (Addendum)
 Pt to ED via POV from home voluntarily with sister. Pt lives with sister. Pt reports pt has hx of bipolar disorder and schizophrenia. Sister reports 24hrs without sleep, been getting more agitated and pulling away when sister tries to give her meds. Sister reports needs to try to keep her safe because she keeps wondering off. Pt denies SI/HI. Pt reports etoh use this morning. Sister reports pt takes about 3-4 shots per day. Denies drugs. Pt's affect incongruent with situation and mood

## 2023-08-04 NOTE — ED Notes (Signed)
Pt refused snack 

## 2023-08-04 NOTE — Consult Note (Signed)
 [facility] Psychiatric Consult Initial  Patient Name: .Jody Eaton  MRN: [id number]  DOB: [birth year]  Consult Order details:  Orders (From admission, onward)     Start     Ordered   08/04/23 1451  CONSULT TO CALL ACT TEAM       Ordering Provider: Claybon Jabs, MD  Provider:  (Not yet assigned)  Question:  Reason for Consult?  Answer:  Psych consult   08/04/23 1450   08/04/23 1451  IP CONSULT TO PSYCHIATRY       Ordering Provider: Claybon Jabs, MD  Provider:  (Not yet assigned)  Question Answer Comment  Consult Timeframe STAT - requires a response within one hour   STAT timeframe requires provider to provider communication, has the provider to provider communication been completed Yes   Reason for Consult? Consult for medication management   Contact phone number where the requesting provider can be reached [phone]      08/04/23 1450             Mode of Visit: Tele-visit Virtual Statement:TELE PSYCHIATRY ATTESTATION & CONSENT As the provider for this telehealth consult, I attest that I verified the patient's identity using two separate identifiers, introduced myself to the patient, provided my credentials, disclosed my location, and performed this encounter via a HIPAA-compliant, real-time, face-to-face, two-way, interactive audio and video platform and with the full consent and agreement of the patient (or guardian as applicable.) Patient physical location: [facility]. Telehealth provider physical location: home office in state of [place].   Video start time: 430 p.m. Video end time: 4:50 PM    Psychiatry Consult Evaluation  Service Date: August 04, 2023 LOS:  LOS: 0 days  Chief Complaint "I had no sleep"  Primary Psychiatric Diagnoses  Agitation 2.  Insomnia  Assessment  Jody Eaton is a 43 y.o. female presents to [facility] emergency department on 08/04/2023 at 2:26 PM due to agitation and insomnia.  Per patient, I came here because I needed some sleep".   Patient reports that her sister transported her to the hospital to get some sleep.  This Clinical research associate contacted patient's sister and sister reports that patient only slept 4 hours yesterday and worked third shift, "when she got off of work she was hyper so I knew I needed to give her medication".  Per her sister, patient then was unable to be located for about 15 minutes afterwards.  Patient's sister reports that patient is currently "manic" and needs to get back on her medications.  Per her record, patient has a history of tobacco use disorder, bipolar 1 disorder, cannabis abuse, schizoaffective disorder bipolar type and insomnia.  Patient reports being previously prescribed Haldol decanoate but has been "taken off that medication".  Patient reports currently taking oral Haldol.  Medication noncompliance suspected.  Patient reports being established with [facility] for outpatient psychiatry but is unsure of her last appointment date.  Per her sister patient is also prescribed hydroxyzine for anxiety. She denies recent illicit substance use.  Patient reports smoking "CBD pre-Rolled" a few days ago.  Patient reports that her last alcohol consumption was 2 shots of vodka yesterday.  Patient denies SI, HI, AVH, paranoia or delusional thought.   Addendum: Patient became agitated and attempted to leave, requiring IM medication for calming prior to evaluation.  Patient noted lying in the bed, calm pleasant and willing to engage at the time of psychiatric evaluation unknowingly, patient received IM medication for calming prior  to visit.  Patient's calm nature likely due to being recently medicated.  Patient to be observed overnight for further agitation or aggression and reassess in the morning.    Diagnoses:  Active Hospital problems: Active Problems:   Agitation    Plan   ## Psychiatric Medication Recommendations:  Restart home medications  ## Medical Decision Making Capacity: Not  specifically addressed in this encounter  ## Further Work-up:  -- Deferred to ED P EKG -- most recent EKG on 03/07/2023 had QtC of 445 -- Pertinent labwork reviewed earlier this admission includes: CMP, CBC, salicylate level and Tylenol level,   ## Disposition:--Reassess in the a.m.  ## Behavioral / Environmental: - No specific recommendations at this time.     ## Safety and Observation Level:  - Based on my clinical evaluation, I estimate the patient to be at low risk of self harm in the current setting. - At this time, we recommend routine. This decision is based on my review of the chart including patient's history and current presentation, interview of the patient, mental status examination, and consideration of suicide risk including evaluating suicidal ideation, plan, intent, suicidal or self-harm behaviors, risk factors, and protective factors. This judgment is based on our ability to directly address suicide risk, implement suicide prevention strategies, and develop a safety plan while the patient is in the clinical setting. Please contact our team if there is a concern that risk level has changed.  CSSR Risk Category:C-SSRS RISK CATEGORY: No Risk  Suicide Risk Assessment: Patient has following modifiable risk factors for suicide: medication noncompliance, which we are addressing by restarting home medication. Patient has following non-modifiable or demographic risk factors for suicide: psychiatric hospitalization Patient has the following protective factors against suicide: Supportive family  Thank you for this consult request. Recommendations have been communicated to the primary team.  We will recommend overnight observation and reassess in the a.m. at this time.   Mcneil Sober, NP       History of Present Illness  Relevant Aspects of Hospital ED Course:  Admitted on 08/04/2023 for agitation and insomnia  Patient Report:  "I ain't had no sleep"  Psych ROS:  Depression:  Denies Anxiety: Yes Mania (lifetime and current): Yes history of bipolar disorder Psychosis: (lifetime and current): Patient has history of schizoaffective disorder  Collateral information:  Contacted patient's sister, Alda Ponder at [phone] on 08/04/2023  Review of Systems  Psychiatric/Behavioral:  Negative for hallucinations and suicidal ideas. The patient is nervous/anxious and has insomnia.   All other systems reviewed and are negative.    Psychiatric and Social History  Psychiatric History:  Information collected from patient patient's sister and ED treatment team  Prev Dx/Sx: See above [facility] Meds (current): Haldol 5 mg daily, hydroxyzine 25 mg 3 times daily as needed for anxiety and melatonin 2.5 mg at bedtime as needed for sleep Previous Med Trials: Haldol decanoate Therapy: No  Prior Psych Hospitalization: Yes Prior Self Harm: No Prior Violence: No  Family Psych History: Denies Family Hx suicide: Denies  Social History:  Developmental Hx: Normal Educational Hx: Some, college Occupational Hx: Counsellor Hx: none Living Situation: lives w/ sister Spiritual Hx: baptist Access to weapons/lethal means: no   Substance History Alcohol: 2 shots of vodka yesterday Type of alcohol vodka Last Drink yesterday  Number of drinks per day to History of alcohol withdrawal seizures denies History of DT's denies Tobacco: Denies Illicit drugs: CBD Prescription drug abuse: Denies Rehab hx:  denies  Exam Findings  Physical Exam: No abnormal movements observed Vital Signs:  Temp:  [98 F (36.7 C)] 98 F (36.7 C) (03/28 1351) Pulse Rate:  [113-126] 113 (03/28 1629) Resp:  [20] 20 (03/28 1351) BP: (121-173)/(88-101) 121/88 (03/28 1629) SpO2:  [98 %] 98 % (03/28 1351) Blood pressure 121/88, pulse (!) 113, temperature 98 F (36.7 C), temperature source Oral, resp. rate 20, last menstrual period 07/09/2023, SpO2 98%.  There is no height or weight on file to calculate BMI.  Physical Exam Vitals and nursing note reviewed.  Constitutional:      Appearance: Normal appearance.  Neurological:     General: No focal deficit present.     Mental Status: She is alert and oriented to person, place, and time.     Mental Status Exam: General Appearance: Fairly groomed  Orientation: Full  Memory: Good  Concentration: Good   Recall: Good  Attention good  Eye Contact: Good  Speech: Clear and coherent  Language: Good  Volume:  Normal  Mood: Good"  Affect:  Appropriate  Thought Process:  Goal Directed  Thought Content:  WDL  Suicidal Thoughts:  No  Homicidal Thoughts: No  Judgement:  Fair  Insight:  Fair  Psychomotor Activity:  Normal  Akathisia:  No  Fund of Knowledge:  Good      Assets:  Communication Skills  Cognition:  WNL  ADL's:  Intact  AIMS (if indicated):        Other History   These have been pulled in through the EMR, reviewed, and updated if appropriate.  Family History:  The patient's family history is not on file.  Medical History: Past Medical History:  Diagnosis Date   Bipolar 1 disorder (HCC)    Schizophrenia (HCC)     Surgical History: History reviewed. No pertinent surgical history.   Medications:   Current Facility-Administered Medications:    haloperidol (HALDOL) tablet 5 mg, 5 mg, Oral, Daily, Cherylanne Ardelean, NP, 5 mg at 08/04/23 1732   hydrOXYzine (ATARAX) tablet 25 mg, 25 mg, Oral, TID PRN, Jacalynn Buzzell, NP   LORazepam (ATIVAN) injection 0-4 mg, 0-4 mg, Intravenous, Q6H **OR** LORazepam (ATIVAN) tablet 0-4 mg, 0-4 mg, Oral, Q6H, Tan, Franchot Erichsen, MD, 1 mg at 08/04/23 1529   [START ON 08/06/2023] LORazepam (ATIVAN) injection 0-4 mg, 0-4 mg, Intravenous, Q12H **OR** [START ON 08/06/2023] LORazepam (ATIVAN) tablet 0-4 mg, 0-4 mg, Oral, Q12H, Tan, Franchot Erichsen, MD   melatonin tablet 2.5 mg, 2.5 mg, Oral, QHS, Messiah Ahr, NP   thiamine (VITAMIN B1) tablet 100 mg, 100 mg,  Oral, Daily, 100 mg at 08/04/23 1530 **OR** thiamine (VITAMIN B1) injection 100 mg, 100 mg, Intravenous, Daily, Tan, Franchot Erichsen, MD  Current Outpatient Medications:    haloperidol (HALDOL) 5 MG tablet, Take 1 tablet (5 mg total) by mouth daily., Disp: 30 tablet, Rfl: 0   haloperidol decanoate (HALDOL DECANOATE) 100 MG/ML injection, Inject 1 mL (100 mg total) into the muscle every 30 (thirty) days. (Patient not taking: Reported on 03/07/2023), Disp: 1 mL, Rfl: 1   melatonin 5 MG TABS, Take 0.5 tablets (2.5 mg total) by mouth at bedtime as needed., Disp: , Rfl:   Allergies: No Known Allergies  Mcneil Sober, NP

## 2023-08-04 NOTE — ED Notes (Signed)
 Pt walked up on housekeeping. Housekeeping came to this Financial planner. Security  and this RN came to triage area. One pt sitting on bench with family member. Out of no where this other psych pt came walking up and walked right behind the housekeeping and stood there staring her down. Sister of pt removed her and went back to middle area. Security aware and watching pt. Charge RN called and informed of situation and need for room sooner than later.

## 2023-08-04 NOTE — ED Notes (Signed)
 Patient provided a warm blanket. Old blanket placed in soiled linen container.

## 2023-08-04 NOTE — ED Provider Notes (Signed)
 Jody Eaton Provider Note    Event Date/Time   First MD Initiated Contact with Patient 08/04/23 1439     (approximate)   History   Psychiatric Evaluation   HPI  SHACONDA HAJDUK is a 43 y.o. female with history of bipolar disorder, schizophrenia, presenting for agitation, refusing her meds, wandering off on her own.  Brought in as a voluntary with sister.  Sister reports that in the last day, she was not sleeping, more agitated, pulling away when sister tries to give her medications.  Sister reports try to keep her say but patient is wandering off on her own.  Patient denies any SI or HI although sister reports that she does drink 3-4 shots per day.  No reported trauma.  History obtained from sister.     Physical Exam   Triage Vital Signs: ED Triage Vitals [08/04/23 1351]  Encounter Vitals Group     BP (!) 157/101     Systolic BP Percentile      Diastolic BP Percentile      Pulse Rate (!) 126     Resp 20     Temp 98 F (36.7 C)     Temp Source Oral     SpO2 98 %     Weight      Height      Head Circumference      Peak Flow      Pain Score 0     Pain Loc      Pain Education      Exclude from Growth Chart     Most recent vital signs: Vitals:   08/04/23 1351  BP: (!) 157/101  Pulse: (!) 126  Resp: 20  Temp: 98 F (36.7 C)  SpO2: 98%     General: Awake, no distress.  CV:  Good peripheral perfusion.  Resp:  Normal effort.  No respiratory distress Abd:  No distention.  Nontender Other:  No external signs of trauma.  No tremors.   ED Results / Procedures / Treatments   Labs (all labs ordered are listed, but only abnormal results are displayed) Labs Reviewed  COMPREHENSIVE METABOLIC PANEL WITH GFR - Abnormal; Notable for the following components:      Result Value   Potassium 3.2 (*)    Glucose, Bld 120 (*)    BUN <5 (*)    All other components within normal limits  ETHANOL - Abnormal; Notable for the following components:    Alcohol, Ethyl (B) 111 (*)    All other components within normal limits  SALICYLATE LEVEL - Abnormal; Notable for the following components:   Salicylate Lvl <7.0 (*)    All other components within normal limits  ACETAMINOPHEN LEVEL - Abnormal; Notable for the following components:   Acetaminophen (Tylenol), Serum <10 (*)    All other components within normal limits  CBC - Abnormal; Notable for the following components:   WBC 12.2 (*)    Platelets 410 (*)    All other components within normal limits  URINE DRUG SCREEN, QUALITATIVE ([facility] ONLY)  PREGNANCY, URINE  POC URINE PREG, ED     PROCEDURES:  Critical Care performed: No  Procedures   MEDICATIONS ORDERED IN ED: Medications  LORazepam (ATIVAN) injection 0-4 mg (has no administration in time range)    Or  LORazepam (ATIVAN) tablet 0-4 mg (has no administration in time range)  LORazepam (ATIVAN) injection 0-4 mg (has no administration in time range)    Or  LORazepam (ATIVAN) tablet 0-4 mg (has no administration in time range)  thiamine (VITAMIN B1) tablet 100 mg (has no administration in time range)    Or  thiamine (VITAMIN B1) injection 100 mg (has no administration in time range)  OLANZapine zydis (ZYPREXA) disintegrating tablet 5 mg (5 mg Oral Given 08/04/23 1443)     IMPRESSION / MDM / ASSESSMENT AND PLAN / ED COURSE  I reviewed the triage vital signs and the nursing notes.                              Differential diagnosis includes, but is not limited to, decompensated psych, medication, compliance, substance use, alcohol use.  Will get labs and plan to medically clear her for psych.  Given that she has reported history of agitation, medication noncompliance, wandering off, for her safety due to her acute psychosis, will put her on an IVC.  Patient's presentation is most consistent with acute presentation with potential threat to life or bodily function.  Nursing exam over concern that patient keeps trying to  wander off despite verbal redirection, given that she is not safe for herself, will give her some p.o. Zyprexa and reassess.  Independent review of labs are below.  Patient is medically clear for psychiatric evaluation.  Will also place her on CIWA protocol since sister reports that she drinks 3-4 shots per day.  Clinical Course as of 08/04/23 1451  Fri Aug 04, 2023  1449 Independent review of labs, alcohol level is 111, Tylenol and salicylate level not elevated, mild leukocytosis but patient has no infectious symptoms, electrolytes not severely deranged.  She is medically cleared for psych evaluation. [TT]    Clinical Course User Index [TT] Jodie Echevaria, Franchot Erichsen, MD     FINAL CLINICAL IMPRESSION(S) / ED DIAGNOSES   Final diagnoses:  Psychosis, unspecified psychosis type (HCC)     Rx / DC Orders   ED Discharge Orders     None        Note:  This document was prepared using Dragon voice recognition software and may include unintentional dictation errors.    Claybon Jabs, MD 08/04/23 [phone]

## 2023-08-04 NOTE — ED Notes (Signed)
 IVC PENDING  CONSULT ?

## 2023-08-04 NOTE — ED Notes (Signed)
 This RN took over assignment of patient. Patient anxious, pacing around the room and in the hallways. Patient consistently being redirected by staff. Patient smiling inappropriately. Environment secure and safe for the patient. Ongoing monitoring by this RN and safety rounder.

## 2023-08-04 NOTE — ED Notes (Addendum)
 Pt dressed out by this RN and EDT: 1 black shirt  1 pair gray jeans 1 pair white and black shoes  1 pair earrings 1 pair black and white socks  1 pair red underwear  1 black bra

## 2023-08-04 NOTE — ED Notes (Signed)
 Patient continuing to pace back and forth in hallway, with inability to redirect patient. Patient making unpredictable movements such as putting fist up and stepping forward quickly. MD Larinda Buttery notified of patient's increased anxiety and agitation. See new orders.

## 2023-08-04 NOTE — ED Notes (Signed)
 Due to pt previous behavior earlier today. VS will not be done till pt wakes up

## 2023-08-04 NOTE — ED Notes (Signed)
 Patient given dinner tray.

## 2023-08-05 LAB — URINE DRUG SCREEN, QUALITATIVE (ARMC ONLY)
Amphetamines, Ur Screen: NOT DETECTED
Barbiturates, Ur Screen: NOT DETECTED
Benzodiazepine, Ur Scrn: POSITIVE — AB
Cannabinoid 50 Ng, Ur ~~LOC~~: POSITIVE — AB
Cocaine Metabolite,Ur ~~LOC~~: NOT DETECTED
MDMA (Ecstasy)Ur Screen: NOT DETECTED
Methadone Scn, Ur: NOT DETECTED
Opiate, Ur Screen: NOT DETECTED
Phencyclidine (PCP) Ur S: NOT DETECTED
Tricyclic, Ur Screen: NOT DETECTED

## 2023-08-05 LAB — PREGNANCY, URINE: Preg Test, Ur: NEGATIVE

## 2023-08-05 MED ORDER — HALOPERIDOL DECANOATE 100 MG/ML IM SOLN
100.0000 mg | INTRAMUSCULAR | Status: DC
  Administered 2023-08-05: 100 mg via INTRAMUSCULAR
  Filled 2023-08-05: qty 1

## 2023-08-05 MED ORDER — TRAZODONE HCL 50 MG PO TABS
50.0000 mg | ORAL_TABLET | Freq: Every day | ORAL | Status: DC
  Administered 2023-08-05: 50 mg via ORAL
  Filled 2023-08-05: qty 1

## 2023-08-05 MED ORDER — HALOPERIDOL 5 MG PO TABS
5.0000 mg | ORAL_TABLET | Freq: Two times a day (BID) | ORAL | Status: DC
  Administered 2023-08-05 – 2023-08-06 (×2): 5 mg via ORAL
  Filled 2023-08-05 (×3): qty 1

## 2023-08-05 NOTE — ED Notes (Signed)
 PT IVC /PENDING REASSESSMENT IN THE A.M.

## 2023-08-05 NOTE — Discharge Instructions (Addendum)
 Substance Use Treatment Options: - Please call and schedule a follow-up appointment to address alcohol/substance use.  [facility] [address] [place] [address] Person [phone]  [facility] [address] [place] [address] Person [phone]  [facility] [address] [place] [address] Person [phone]  [facility] [address] [place] [address] [place] [phone]  [facility] [address] [place] [address] [place] [phone]  [facility] [address] [place] [address] [place] [phone]  [facility] [address] [place] [address] Viann Shove [phone]  [facility] [address] [place] [address] Erskine Emery [phone]

## 2023-08-05 NOTE — ED Provider Notes (Signed)
 Emergency Medicine Observation Re-evaluation Note  Jody Eaton is a 43 y.o. female, seen on rounds today.  Pt initially presented to the ED for complaints of Psychiatric Evaluation  Currently, the patient is is no acute distress. Denies any concerns at this time.  Physical Exam  Blood pressure 116/80, pulse 98, temperature 98.3 F (36.8 C), temperature source Oral, resp. rate 17, last menstrual period 07/09/2023, SpO2 99%.  Physical Exam: General: No apparent distress Pulm: Normal WOB Neuro: Moving all extremities Psych: Resting comfortably     ED Course / MDM   Clinical Course as of 08/05/23 0333  Fri Aug 04, 2023  1449 Independent review of labs, alcohol level is 111, Tylenol and salicylate level not elevated, mild leukocytosis but patient has no infectious symptoms, electrolytes not severely deranged.  She is medically cleared for psych evaluation. [TT]    Clinical Course User Index [TT] Claybon Jabs, MD    I have reviewed the labs performed to date as well as medications administered while in observation.  Recent changes in the last 24 hours include: No acute events overnight.  Plan   Current plan: Patient awaiting psychiatric disposition. Patient is under full IVC at this time.    Jody Eaton, Jody Maw, DO 08/05/23 [phone]

## 2023-08-05 NOTE — ED Notes (Signed)
Pt declines urine sample at this time.

## 2023-08-05 NOTE — BH Assessment (Signed)
 TTS attached the following outpatient alcohol/substance use resources to patient's AVS.   Substance Use Treatment Options: - Please call and schedule a follow-up appointment to address alcohol/substance use.  [facility] [address] [place] [address] Person [phone]  [facility] [address] [place] [address] Person [phone]  [facility] [address] [place] [address] Person [phone]  [facility] [address] [place] [address] [place] [phone]  [facility] [address] [place] [address] [place] [phone]  [facility] [address] [place] [address] [place] [phone]  [facility] [address] [place] [address] Viann Shove [phone]  [facility] [address] [place] [address] Erskine Emery [phone]         Gregary Signs, [facility] Therapeutic Triage Specialist Phone: [phone]

## 2023-08-05 NOTE — ED Notes (Signed)
 Pt given breakfast tray

## 2023-08-05 NOTE — ED Notes (Signed)
 Pt given lunch tray.

## 2023-08-05 NOTE — ED Notes (Signed)
 Pt given water after trying to leave the [place] area.

## 2023-08-05 NOTE — ED Notes (Signed)
Patient is IVC pending placement 

## 2023-08-05 NOTE — ED Notes (Signed)
 Pt awake standing at doorway, denies any needs at this time. Will take periods of rest to sleep and then continue standing at door way, grabbed wipes to clean her face, and went back into room.

## 2023-08-05 NOTE — Consult Note (Signed)
 [facility] Psychiatric Consult Initial  Patient Name: .Jody Eaton  MRN: [id number]  DOB: 08/31/1980  Consult Order details:  Orders (From admission, onward)     Start     Ordered   08/04/23 1451  CONSULT TO CALL ACT TEAM       Ordering Provider: Claybon Jabs, MD  Provider:  (Not yet assigned)  Question:  Reason for Consult?  Answer:  Psych consult   08/04/23 1450   08/04/23 1451  IP CONSULT TO PSYCHIATRY       Ordering Provider: Claybon Jabs, MD  Provider:  (Not yet assigned)  Question Answer Comment  Consult Timeframe STAT - requires a response within one hour   STAT timeframe requires provider to provider communication, has the provider to provider communication been completed Yes   Reason for Consult? Consult for medication management   Contact phone number where the requesting provider can be reached [phone]      08/04/23 1450             Mode of Visit: Tele-visit Virtual Statement:TELE PSYCHIATRY ATTESTATION & CONSENT As the provider for this telehealth consult, I attest that I verified the patient's identity using two separate identifiers, introduced myself to the patient, provided my credentials, disclosed my location, and performed this encounter via a HIPAA-compliant, real-time, face-to-face, two-way, interactive audio and video platform and with the full consent and agreement of the patient (or guardian as applicable.) Patient physical location: [facility]. Telehealth provider physical location: home office in state of [place].   Video start time: 1010. Video end time: 1036    Psychiatry Consult Evaluation  Service Date: August 05, 2023 LOS:  LOS: 0 days  Chief Complaint " I'm ready to go home"  Primary Psychiatric Diagnoses  Bipolar 1 Disorder, hypomanic 2.  Insomnia 3.  Agitation  Assessment  Jody Eaton is a 43 y.o. female presents to [facility] emergency department on 08/04/2023 at 2:26 PM due to agitation and insomnia.     Patient was brought to the emergency department voluntary, by her sister with c/o manic behaviors, insufficient sleep and hyperactivity.  Patient has a hx for Bipolar 1 Disorder, cannabis abuse, schizoaffective disorder bipolar type and insomnia. Her presentation was complicated by her reported cannabus and alcohol use.  Of note, her admission BAL was 111 and her UDS was negative. She used to take haldol decanoate but has been off her medications for unknown period of time. Based on above, appears patient's mental decline was triggered by medication non-compliance and polysubstance usage.    She previously denied suicidal or homicidal ideations and audible or visual hallucinations.  Per lab review, her K was low at 3.2 and WBCs were slightly elevated at 12.2.  Neither were thought to be contributing to her current presentation.   She was placed under IVC and given IM medication after becoming agitated and attempting to leave the hospital. Psychiatry recommended her for overnight observation and AM psychiatric reassessment.   On reassessment today, she is alert and oriented x4; since being restarted on haldol 5mg  po daily, she 's less agitated and cooperative. She was placed on CIWA-ativan protocol d/t potential for alcohol withdrawal but her CIWA scores have remained low and she is not tremulous on assessment.  She has not had sx suggestive of alcohol related withdrawal or complications.  She does present, with a non-chalant attitude when asked about HPI of mental decline and medication non-adherence.  She express her willingness to receive  haldol LAI 100mg  (30 days supply) today but per pharmacy med reconciliation she is not due until 08/10/2023.  Will plan to give LAI early.  Per nursing, patient was more hyperactive at 5am this morning but with rest and taking her medications, her behaviors have improved. Nursing reports her sleep has improved and she's been very calm and cooperative with staff and  security.   Will plan to give haldol LAI today and re-evaluate in the AM for possible discharge.    Diagnoses:  Active Hospital problems: Principal Problem:   Bipolar I disorder (HCC) Active Problems:   Insomnia   Agitation    Plan   ## Psychiatric Medication Recommendations:  -Continue home medications: -Hydroxyzine 25mg  po TID PRN anxiety -Increase to Haldol 5mg  po BID for mood stability -Start Trazodone mg po at bedtime for sleep  ## Medical Decision Making Capacity:  Patient maker her own medical decision; however she is currently under IVC.  ## Further Work-up:  EKG -- most recent EKG on 03/07/2023 had QtC of 445 -- Pertinent labwork reviewed earlier this admission includes: CMP, CBC, salicylate level and Tylenol level,   ## Disposition:- As per above, Haldol Decanoate 100mg  LAI ordered for today; Trazodone 50mg  po at bedtime added for sleep.  We will monitor her response to medication overnight and plan for psych reassessment in the AM.  Pending her mood and behaviors remain improved, will recommend for discharge with outpatient follow up.    ## Behavioral / Environmental: -Recommend using specific terminology regarding PNES, i.e. call the episodes "non-epileptic seizures" rather than "pseudoseizures" as the latter insinuates "fake" or "feigned" symptoms, when the events are a very real experience to the patient and are a physical, non-volitional, manifestation of fear, pain and anxiety.  or Utilize compassion and acknowledge the patient's experiences while setting clear and realistic expectations for care.   ## Safety and Observation Level:  - Based on my clinical evaluation, I estimate the patient to be at low risk of self harm in the current setting. - At this time, we recommend routine. This decision is based on my review of the chart including patient's history and current presentation, interview of the patient, mental status examination, and consideration of suicide  risk including evaluating suicidal ideation, plan, intent, suicidal or self-harm behaviors, risk factors, and protective factors. This judgment is based on our ability to directly address suicide risk, implement suicide prevention strategies, and develop a safety plan while the patient is in the clinical setting. Please contact our team if there is a concern that risk level has changed.  CSSR Risk Category:C-SSRS RISK CATEGORY: No Risk  Suicide Risk Assessment: Patient has following modifiable risk factors for suicide: medication noncompliance, and substance abuse which we are addressing by restarting home medication and adding trazodone for sleep. Patient has following non-modifiable or demographic risk factors for suicide: psychiatric hospitalization Patient has the following protective factors against suicide: Supportive family  Thank you for this consult request. Recommendations have been communicated to the primary team.  We will sign off  at this time.   Chales Abrahams, NP       History of Present Illness  Relevant Aspects of Hospital ED Course:  Admitted on 08/04/2023 for agitation and insomnia; decompensated bipolar disorder in the setting of medication non-adherence and polysubstance use/abuse.   Patient Report:  "I'm ready to go home." Patient seen for psychiatric reassessment today.  She is observed in a private room, asleep.  She is awakened by verbal prompts  from nursing and asked to participate in telepsych assessment.  Patient is greeted by this Clinical research associate and given anticipatory guidance. She is alert and oriented x4; her hair is not combed, she's observed trying to groom her hair with her hands.  She makes minimal eye contact with this Clinical research associate.  She appears tired and observed closing her eyes intermittently during assessment.  Nevertheless, she is cooperative and agrees to sit up in bed when asked. She reports her sleep was "up and down but I slept better"  last night.  She 's slow to  respond but appears she's tired, no cognition concerns noted.  Her thoughts are coherent and goal oriented.  She She appears guarded in some of her responses when asked about outpatient medication compliance.  Per chart review, patient has been non-compliant with her medications.  She tells me today, she cannot name her medications but takes them as prescribed.  She reports she was given haldol LAI injection, last month but she cannot remember the date.  She states she's due for an injection in April but not sure of when. She is unsure of when her next [facility] OP appointment is scheduled.  She reports she drinks alcohol, undisclosed amount daily.  She looked annoyed at this Clinical research associate when asked if she felt she should cut down on her drinking.  She answered no to all questions to CAGE, alcohol screening. She states she is not interested in SA resources.  She states she is not suicidal, homicidal and denies AVH.    Psych ROS:  Depression: Denies Anxiety: Yes Mania (lifetime and current): Yes history of bipolar disorder Psychosis: (lifetime and current): Patient has history of schizoaffective disorder  Collateral information:  Per chart review, patient's sister, Alda Ponder was contacted at [phone] on 08/04/2023.  Review of Systems  Constitutional: Negative.   HENT: Negative.    Eyes: Negative.   Respiratory: Negative.    Cardiovascular: Negative.   Gastrointestinal: Negative.   Genitourinary: Negative.   Musculoskeletal: Negative.   Skin: Negative.   Neurological: Negative.   Endo/Heme/Allergies: Negative.   Psychiatric/Behavioral:  Positive for substance abuse. Negative for depression, hallucinations and suicidal ideas.   All other systems reviewed and are negative.    Psychiatric and Social History  Psychiatric History:  Information collected from patient patient's sister and ED treatment team  Prev Dx/Sx: See above Current Psych Provider: [facility] Meds (current): Haldol  5 mg daily, hydroxyzine 25 mg 3 times daily as needed for anxiety and melatonin 2.5 mg at bedtime as needed for sleep--it it unclear medication adherence.  Previous Med Trials: Haldol decanoate Therapy: No  Prior Psych Hospitalization: Yes Prior Self Harm: No Prior Violence: No  Family Psych History: Denies Family Hx suicide: Denies  Social History:  Developmental Hx: Normal Educational Hx: Some, college Occupational Hx: Counsellor Hx: none Living Situation: lives w/ sister Spiritual Hx: baptist Access to weapons/lethal means: no   Substance History Alcohol: she previously reported 2 shots of vodka prior to admission but her BAL was 111.  Type of alcohol vodka Last Drink: she reports she drinks alcohol daily Number of drinks per day: she does not disclose History of alcohol withdrawal seizures: she denies withdrawal symptoms, nausea, vomiting, tremors, disorientation, confusion. History of DT's denies Tobacco: Denies Illicit drugs: CBD Prescription drug abuse: pt denies Rehab hx: she denies  Exam Findings  Physical Exam: No abnormal movements observed Vital Signs:  Temp:  [97.7 F (36.5 C)-98.3 F (36.8 C)] 97.7 F (36.5  C) (03/29 0950) Pulse Rate:  [90-116] 90 (03/29 0950) Resp:  [16-17] 16 (03/29 0950) BP: (105-173)/(80-93) 105/83 (03/29 0950) SpO2:  [99 %-100 %] 100 % (03/29 0950) Blood pressure 105/83, pulse 90, temperature 97.7 F (36.5 C), temperature source Oral, resp. rate 16, last menstrual period 07/09/2023, SpO2 100%. There is no height or weight on file to calculate BMI.  Physical Exam Vitals and nursing note reviewed.  Constitutional:      Appearance: Normal appearance.  Musculoskeletal:        General: Normal range of motion.  Neurological:     General: No focal deficit present.     Mental Status: She is alert and oriented to person, place, and time.  Psychiatric:        Attention and Perception: Perception normal.        Mood and  Affect: Mood normal. Affect is blunt.        Speech: Speech normal.        Behavior: Behavior normal. Behavior is cooperative.        Thought Content: Thought content normal.        Cognition and Memory: Cognition and memory normal.        Judgment: Judgment is impulsive (at baseline d/t medication non-adherence and polysubstance usage.).     Mental Status Exam: General Appearance: Patient was asleep prior to assessment; her hair is not combed; she is appropriately dressed in hospital scrubs  Orientation: A&Ox4  Memory: Good  Concentration: Fair, as she appears tired and has difficulty remaining awake.  Recall: Good  Attention: Fair  Eye Contact: Minimal  Speech: Clear and coherent  Language: Good  Volume:  Normal  Mood: Good"  Affect:  Appropriate  Thought Process:  Goal Directed  Thought Content:  WDL  Suicidal Thoughts:  No  Homicidal Thoughts: No  Judgement:  Fair in the setting of alcohol use and psychotropic med non-adherence  Insight:  Fair  Psychomotor Activity:  Normal  Akathisia:  No  Fund of Knowledge:  Fair      Assets:  Architect Housing  Cognition:  WNL  ADL's:  Intact  AIMS (if indicated):   no involuntary movements observed     Other History   These have been pulled in through the EMR, reviewed, and updated if appropriate.  Family History:  The patient's family history is not on file.  Medical History: Past Medical History:  Diagnosis Date   Bipolar 1 disorder (HCC)    Schizophrenia (HCC)     Surgical History: History reviewed. No pertinent surgical history.   Medications:   Current Facility-Administered Medications:    haloperidol (HALDOL) tablet 5 mg, 5 mg, Oral, Daily, Penn, Cicely, NP, 5 mg at 08/05/23 1100   hydrOXYzine (ATARAX) tablet 25 mg, 25 mg, Oral, TID PRN, Penn, Cicely, NP, 25 mg at 08/05/23 0511   LORazepam (ATIVAN) injection 0-4 mg, 0-4 mg, Intravenous, Q6H **OR** LORazepam (ATIVAN)  tablet 0-4 mg, 0-4 mg, Oral, Q6H, Tan, Franchot Erichsen, MD, 2 mg at 08/05/23 0511   [START ON 08/06/2023] LORazepam (ATIVAN) injection 0-4 mg, 0-4 mg, Intravenous, Q12H **OR** [START ON 08/06/2023] LORazepam (ATIVAN) tablet 0-4 mg, 0-4 mg, Oral, Q12H, Tan, Franchot Erichsen, MD   melatonin tablet 2.5 mg, 2.5 mg, Oral, QHS, Penn, Cicely, NP, 2.5 mg at 08/04/23 2122   thiamine (VITAMIN B1) tablet 100 mg, 100 mg, Oral, Daily, 100 mg at 08/05/23 1100 **OR** thiamine (VITAMIN B1) injection 100 mg, 100 mg, Intravenous, Daily, Bing Neighbors  Roda Shutters, MD  Current Outpatient Medications:    hydrOXYzine (ATARAX) 25 MG tablet, Take 25 mg by mouth 3 (three) times daily as needed., Disp: , Rfl:    meloxicam (MOBIC) 7.5 MG tablet, Take 7.5 mg by mouth daily., Disp: , Rfl:    haloperidol (HALDOL) 5 MG tablet, Take 1 tablet (5 mg total) by mouth daily., Disp: 30 tablet, Rfl: 0   haloperidol decanoate (HALDOL DECANOATE) 100 MG/ML injection, Inject 1 mL (100 mg total) into the muscle every 30 (thirty) days. (Patient not taking: Reported on 03/07/2023), Disp: 1 mL, Rfl: 1   melatonin 5 MG TABS, Take 0.5 tablets (2.5 mg total) by mouth at bedtime as needed., Disp: , Rfl:    methocarbamol (ROBAXIN) 500 MG tablet, Take 500 mg by mouth every 6 (six) hours as needed for muscle spasms., Disp: , Rfl:   Allergies: No Known Allergies  Chales Abrahams, NP

## 2023-08-06 DIAGNOSIS — F319 Bipolar disorder, unspecified: Secondary | ICD-10-CM

## 2023-08-06 NOTE — ED Notes (Signed)
 IVC/Pending Placement

## 2023-08-06 NOTE — ED Notes (Signed)
 Pt provided with shower supplies and clean scrubs and non-slip socks.

## 2023-08-06 NOTE — Consult Note (Signed)
 [facility] Psychiatric Consult Follow-up  Patient Name: .Jody Eaton  MRN: [id number]  DOB: [birth year]  Consult Order details:  Orders (From admission, onward)     Start     Ordered   08/04/23 1451  CONSULT TO CALL ACT TEAM       Ordering Provider: Claybon Jabs, MD  Provider:  (Not yet assigned)  Question:  Reason for Consult?  Answer:  Psych consult   08/04/23 1450   08/04/23 1451  IP CONSULT TO PSYCHIATRY       Ordering Provider: Claybon Jabs, MD  Provider:  (Not yet assigned)  Question Answer Comment  Consult Timeframe STAT - requires a response within one hour   STAT timeframe requires provider to provider communication, has the provider to provider communication been completed Yes   Reason for Consult? Consult for medication management   Contact phone number where the requesting provider can be reached [phone]      08/04/23 1450             Mode of Visit: Tele-visit Virtual Statement:TELE PSYCHIATRY ATTESTATION & CONSENT As the provider for this telehealth consult, I attest that I verified the patient's identity using two separate identifiers, introduced myself to the patient, provided my credentials, disclosed my location, and performed this encounter via a HIPAA-compliant, real-time, face-to-face, two-way, interactive audio and video platform and with the full consent and agreement of the patient (or guardian as applicable.) Patient physical location: [facility]. Telehealth provider physical location: home office in state of .   Video start time: 2:20 PM Video end time: 2:40 PM    Psychiatry Consult Evaluation  Service Date: August 06, 2023 LOS:  LOS: 0 days  Chief Complaint "I just been sleeping.  I am ready to go home"  Primary Psychiatric Diagnoses  Bipolar disorder   Assessment  Jody Eaton is a 43 y.o. female admitted: Presented to the ED on 08/04/2023  2:26 PM for mania.  Psychiatric reassessment completed today to determine disposition.  08/04/2023 initial  note:  Jody Eaton is a 43 y.o. female presents to [facility] emergency department on 08/04/2023 at 2:26 PM due to agitation and insomnia.  Per patient, I came here because I needed some sleep".  Patient reports that her sister transported her to the hospital to get some sleep.  This Clinical research associate contacted patient's sister and sister reports that patient only slept 4 hours yesterday and worked third shift, "when she got off of work she was hyper so I knew I needed to give her medication".  Per her sister, patient then was unable to be located for about 15 minutes afterwards.  Patient's sister reports that patient is currently "manic" and needs to get back on her medications.   Per her record, patient has a history of tobacco use disorder, bipolar 1 disorder, cannabis abuse, schizoaffective disorder bipolar type and insomnia.  Patient reports being previously prescribed Haldol decanoate but has been "taken off that medication".  Patient reports currently taking oral Haldol.  Medication noncompliance suspected.  Patient reports being established with [facility] for outpatient psychiatry but is unsure of her last appointment date.  Per her sister patient is also prescribed hydroxyzine for anxiety. She denies recent illicit substance use.  Patient reports smoking "CBD pre-Rolled" a few days ago.  Patient reports that her last alcohol consumption was 2 shots of vodka yesterday.   Patient denies SI, HI, AVH, paranoia or delusional thought.   Addendum: Patient  became agitated and attempted to leave, requiring IM medication for calming prior to evaluation.    08/06/23  Today, patient is calm, pleasant and engaging appropriately. She reports getting rest since being admitted to the ED and feels as though she "needed that sleep". Patient was previously restarted on haldol dec. Patient tolerated injection well and reports no adverse medication effects.  Patient has also been  compliant with oral medication.  She reports feeling better, as opposed to her initial ED arrival.  She reports good appetite.  Patient states I am ready to go home because I have to go to work tonight.  She denies SI or HI AVH, paranoia or delusional thought.  Patient educated on the importance of refraining from continued marijuana use, as she reports recently smoking marijuana.   The patient is clear and coherent in thought and speech.  No further agitation observed during ED observation.  Patient does not meet inpatient psychiatric criteria.  Patient is psychiatrically cleared once medically clear. Patient recommended to follow up with outpatient psychiatric provider following discharge for continued medication and symptom monitoring.    Diagnoses:  Active Hospital problems: Principal Problem:   Bipolar I disorder (HCC) Active Problems:   Insomnia   Agitation    Plan   ## Psychiatric Medication Recommendations:  Continue current medication regimen.  ## Medical Decision Making Capacity: Not specifically addressed in this encounter  ## Further Work-up:  -- Defer to ED P EKG -- most recent EKG on 08/06/2023 had QtC of 426 -- Pertinent labwork reviewed earlier this admission includes: CBC, CMP, UDS   ## Disposition:-- There are no psychiatric contraindications to discharge at this time  ## Behavioral / Environmental: - No specific recommendations at this time.     ## Safety and Observation Level:  - Based on my clinical evaluation, I estimate the patient to be at low risk of self harm in the current setting. - At this time, we recommend seeing. This decision is based on my review of the chart including patient's history and current presentation, interview of the patient, mental status examination, and consideration of suicide risk including evaluating suicidal ideation, plan, intent, suicidal or self-harm behaviors, risk factors, and protective factors. This judgment is based on  our ability to directly address suicide risk, implement suicide prevention strategies, and develop a safety plan while the patient is in the clinical setting. Please contact our team if there is a concern that risk level has changed.  CSSR Risk Category:C-SSRS RISK CATEGORY: No Risk  Suicide Risk Assessment: Patient has following modifiable risk factors for suicide: medication noncompliance, which we are addressing by restarting Haldol Decanoate and recommended follow-up with patient's outpatient psychiatric provider following discharge. Patient has following non-modifiable or demographic risk factors for suicide: psychiatric hospitalization Patient has the following protective factors against suicide: Supportive family  Thank you for this consult request. Recommendations have been communicated to the primary team.  We will psychiatrically clear patient once medically cleared at this time.   Mcneil Sober, NP       History of Present Illness  Relevant Aspects of Hospital ED Course:  Admitted on 08/04/2023 for mania.   Patient Report:  I just been sleeping  Psych ROS:  Psych ROS:  Depression: Denies Anxiety: Yes Mania (lifetime and current): Yes history of bipolar disorder Psychosis: (lifetime and current): Patient has history of schizoaffective disorder   Collateral information:  Contacted patient's sister, Alda Ponder at [phone] on 08/04/2023   Review of Systems  Psychiatric/Behavioral:  Negative for hallucinations and suicidal ideas. The patient is nervous/anxious and has insomnia.   All other systems reviewed and are negative.     Psychiatric and Social History  Psychiatric History:  Information collected from patient patient's sister and ED treatment team   Prev Dx/Sx: See above [facility] Home Meds (current): Haldol 5 mg daily, hydroxyzine 25 mg 3 times daily as needed for anxiety and melatonin 2.5 mg at bedtime as needed for  sleep Previous Med Trials: Haldol decanoate Therapy: No   Prior Psych Hospitalization: Yes Prior Self Harm: No Prior Violence: No   Family Psych History: Denies Family Hx suicide: Denies   Social History:  Developmental Hx: Normal Educational Hx: Some, college Occupational Hx: Counsellor Hx: none Living Situation: lives w/ sister Spiritual Hx: baptist Access to weapons/lethal means: no    Substance History Alcohol: 2 shots of vodka yesterday Type of alcohol vodka Last Drink yesterday  Number of drinks per day to History of alcohol withdrawal seizures denies History of DT's denies Tobacco: Denies Illicit drugs: CBD Prescription drug abuse: Denies Rehab hx: denies  Exam Findings  Physical Exam: No abnormalities observed Vital Signs:  Temp:  [98.6 F (37 C)-99 F (37.2 C)] 98.6 F (37 C) (03/30 0838) Pulse Rate:  [77-100] 77 (03/30 0838) Resp:  [16-19] 16 (03/30 0838) BP: (120-122)/(72-77) 120/72 (03/30 0838) SpO2:  [99 %] 99 % (03/30 0838) Blood pressure 120/72, pulse 77, temperature 98.6 F (37 C), temperature source Oral, resp. rate 16, last menstrual period 07/09/2023, SpO2 99%. There is no height or weight on file to calculate BMI.  Physical Exam Vitals and nursing note reviewed.  Constitutional:      Appearance: Normal appearance.  Neurological:     General: No focal deficit present.     Mental Status: She is alert and oriented to person, place, and time.  Psychiatric:        Mood and Affect: Mood normal.        Behavior: Behavior normal.        Thought Content: Thought content normal.        Judgment: Judgment normal.     Mental Status Exam: General Appearance: Appropriate for environment  Orientation:  Full (Time, Place, and Person)  Memory:  Immediate;   Good Recent;   Good Remote;   Good  Concentration:  Concentration: Good  Recall:  Good  Attention  Good  Eye Contact:  Good  Speech:  Clear and Coherent  Language:  Good   Volume:  Normal  Mood: Good  Affect: Congruent appropriate  Thought Process:  Coherent  Thought Content:  Logical  Suicidal Thoughts: No  Homicidal Thoughts: No  Judgement: Good  Insight: Good  Psychomotor Activity: Normal  Akathisia: No  Fund of Knowledge: Good      Assets:  Communication Skills Desire for Improvement Financial Resources/Insurance Housing Social Support  Cognition:  WNL  ADL's:  Intact  AIMS (if indicated):        Other History   These have been pulled in through the EMR, reviewed, and updated if appropriate.  Family History:  The patient's family history is not on file.  Medical History: Past Medical History:  Diagnosis Date   Bipolar 1 disorder (HCC)    Schizophrenia (HCC)     Surgical History: History reviewed. No pertinent surgical history.   Medications:   Current Facility-Administered Medications:    haloperidol (HALDOL) tablet 5 mg, 5 mg, Oral, BID, Ophelia Shoulder  E, NP, 5 mg at 08/06/23 0839   haloperidol decanoate (HALDOL DECANOATE) 100 MG/ML injection 100 mg, 100 mg, Intramuscular, Q30 days, Ophelia Shoulder E, NP, 100 mg at 08/05/23 1647   hydrOXYzine (ATARAX) tablet 25 mg, 25 mg, Oral, TID PRN, Jeslie Lowe, NP, 25 mg at 08/05/23 0511   LORazepam (ATIVAN) injection 0-4 mg, 0-4 mg, Intravenous, Q12H **OR** LORazepam (ATIVAN) tablet 0-4 mg, 0-4 mg, Oral, Q12H, Tan, Franchot Erichsen, MD   melatonin tablet 2.5 mg, 2.5 mg, Oral, QHS, Jersee Winiarski, NP, 2.5 mg at 08/05/23 2116   thiamine (VITAMIN B1) tablet 100 mg, 100 mg, Oral, Daily, 100 mg at 08/06/23 0839 **OR** thiamine (VITAMIN B1) injection 100 mg, 100 mg, Intravenous, Daily, Tan, Franchot Erichsen, MD   traZODone (DESYREL) tablet 50 mg, 50 mg, Oral, QHS, Ophelia Shoulder E, NP, 50 mg at 08/05/23 2117  Current Outpatient Medications:    hydrOXYzine (ATARAX) 25 MG tablet, Take 25 mg by mouth 3 (three) times daily as needed., Disp: , Rfl:    meloxicam (MOBIC) 7.5 MG tablet, Take 7.5 mg by mouth daily., Disp: ,  Rfl:    haloperidol (HALDOL) 5 MG tablet, Take 1 tablet (5 mg total) by mouth daily., Disp: 30 tablet, Rfl: 0   haloperidol decanoate (HALDOL DECANOATE) 100 MG/ML injection, Inject 1 mL (100 mg total) into the muscle every 30 (thirty) days. (Patient not taking: Reported on 03/07/2023), Disp: 1 mL, Rfl: 1   melatonin 5 MG TABS, Take 0.5 tablets (2.5 mg total) by mouth at bedtime as needed., Disp: , Rfl:    methocarbamol (ROBAXIN) 500 MG tablet, Take 500 mg by mouth every 6 (six) hours as needed for muscle spasms., Disp: , Rfl:   Allergies: No Known Allergies  Mcneil Sober, NP

## 2023-08-06 NOTE — ED Provider Notes (Signed)
 Emergency Medicine Observation Re-evaluation Note  Jody Eaton is a 43 y.o. female, seen on rounds today.  Pt initially presented to the ED for complaints of Psychiatric Evaluation  Currently, the patient is resting comfortably.  Physical Exam  BP 122/77   Pulse 100   Temp 99 F (37.2 C) (Oral)   Resp 19   LMP 07/09/2023   SpO2 99%  General: No acute distress Cardiac: Well-perfused extremities Lungs: No respiratory distress Psych: Appropriate mood and affect  ED Course / MDM  EKG:   I have reviewed the labs performed to date as well as medications administered while in observation.  Recent changes in the last 24 hours include none.  Plan  Current plan is for placement.   Merwyn Katos, MD 08/06/23 [phone]

## 2023-08-06 NOTE — ED Notes (Signed)
Lunch tray and beverage provided 

## 2023-08-06 NOTE — ED Notes (Signed)
 Afternoon snack of pretzels given.

## 2023-08-06 NOTE — ED Notes (Signed)
 Hospital meal provided, pt tolerated w/o complaints.  Waste discarded appropriately.

## 2023-08-06 NOTE — ED Notes (Signed)
 IVC/Pending placement

## 2024-03-16 ENCOUNTER — Emergency Department: Payer: Self-pay

## 2024-03-16 ENCOUNTER — Emergency Department
Admission: EM | Admit: 2024-03-16 | Discharge: 2024-03-20 | Disposition: A | Discharge status: Other Acute Inpatient Hospital | Payer: Self-pay | Attending: Emergency Medicine | Admitting: Emergency Medicine

## 2024-03-16 ENCOUNTER — Other Ambulatory Visit: Payer: Self-pay

## 2024-03-16 DIAGNOSIS — F1090 Alcohol use, unspecified, uncomplicated: Secondary | ICD-10-CM | POA: Insufficient documentation

## 2024-03-16 DIAGNOSIS — F250 Schizoaffective disorder, bipolar type: Secondary | ICD-10-CM | POA: Insufficient documentation

## 2024-03-16 DIAGNOSIS — Y906 Blood alcohol level of 120-199 mg/100 ml: Secondary | ICD-10-CM | POA: Insufficient documentation

## 2024-03-16 DIAGNOSIS — F411 Generalized anxiety disorder: Secondary | ICD-10-CM | POA: Insufficient documentation

## 2024-03-16 DIAGNOSIS — R059 Cough, unspecified: Secondary | ICD-10-CM | POA: Insufficient documentation

## 2024-03-16 DIAGNOSIS — F309 Manic episode, unspecified: Secondary | ICD-10-CM | POA: Insufficient documentation

## 2024-03-16 DIAGNOSIS — G4700 Insomnia, unspecified: Secondary | ICD-10-CM | POA: Insufficient documentation

## 2024-03-16 DIAGNOSIS — E876 Hypokalemia: Secondary | ICD-10-CM | POA: Insufficient documentation

## 2024-03-16 LAB — CBC
HCT: 37.2 % (ref 36.0–46.0)
Hemoglobin: 12.6 g/dL (ref 12.0–15.0)
MCH: 32.6 pg (ref 26.0–34.0)
MCHC: 33.9 g/dL (ref 30.0–36.0)
MCV: 96.1 fL (ref 80.0–100.0)
Platelets: 444 K/uL — ABNORMAL HIGH (ref 150–400)
RBC: 3.87 MIL/uL (ref 3.87–5.11)
RDW: 13.6 % (ref 11.5–15.5)
WBC: 9.6 K/uL (ref 4.0–10.5)
nRBC: 0 % (ref 0.0–0.2)

## 2024-03-16 LAB — BASIC METABOLIC PANEL WITH GFR
Anion gap: 12 (ref 5–15)
BUN: 6 mg/dL (ref 6–20)
CO2: 23 mmol/L (ref 22–32)
Calcium: 9.1 mg/dL (ref 8.9–10.3)
Chloride: 108 mmol/L (ref 98–111)
Creatinine, Ser: 0.9 mg/dL (ref 0.44–1.00)
GFR, Estimated: >60 mL/min (ref >60)
Glucose, Bld: 108 mg/dL — ABNORMAL HIGH (ref 70–99)
Potassium: 4 mmol/L (ref 3.5–5.1)
Sodium: 143 mmol/L (ref 135–145)

## 2024-03-16 LAB — COMPREHENSIVE METABOLIC PANEL WITH GFR
ALT: 25 U/L (ref 0–44)
AST: 32 U/L (ref 15–41)
Albumin: 3.4 g/dL — ABNORMAL LOW (ref 3.5–5.0)
Alkaline Phosphatase: 53 U/L (ref 38–126)
Anion gap: 12 (ref 5–15)
BUN: <5 mg/dL — ABNORMAL LOW (ref 6–20)
CO2: 23 mmol/L (ref 22–32)
Calcium: 8.6 mg/dL — ABNORMAL LOW (ref 8.9–10.3)
Chloride: 106 mmol/L (ref 98–111)
Creatinine, Ser: 0.82 mg/dL (ref 0.44–1.00)
GFR, Estimated: >60 mL/min (ref >60)
Glucose, Bld: 112 mg/dL — ABNORMAL HIGH (ref 70–99)
Potassium: 2.9 mmol/L — ABNORMAL LOW (ref 3.5–5.1)
Sodium: 141 mmol/L (ref 135–145)
Total Bilirubin: 0.5 mg/dL (ref 0.0–1.2)
Total Protein: 7.9 g/dL (ref 6.5–8.1)

## 2024-03-16 LAB — RESP PANEL BY RT-PCR (RSV, FLU A&B, COVID)  RVPGX2
Influenza A by PCR: NEGATIVE
Influenza B by PCR: NEGATIVE
Resp Syncytial Virus by PCR: NEGATIVE
SARS Coronavirus 2 by RT PCR: NEGATIVE

## 2024-03-16 LAB — URINE DRUG SCREEN, QUALITATIVE (ARMC ONLY)
Amphetamines, Ur Screen: NOT DETECTED
Barbiturates, Ur Screen: NOT DETECTED
Benzodiazepine, Ur Scrn: NOT DETECTED
Cannabinoid 50 Ng, Ur ~~LOC~~: POSITIVE — AB
Cocaine Metabolite,Ur ~~LOC~~: NOT DETECTED
MDMA (Ecstasy)Ur Screen: NOT DETECTED
Methadone Scn, Ur: NOT DETECTED
Opiate, Ur Screen: NOT DETECTED
Phencyclidine (PCP) Ur S: NOT DETECTED
Tricyclic, Ur Screen: NOT DETECTED

## 2024-03-16 LAB — ACETAMINOPHEN LEVEL: Acetaminophen (Tylenol), Serum: <10 ug/mL — ABNORMAL LOW (ref 10–30)

## 2024-03-16 LAB — ETHANOL: Alcohol, Ethyl (B): 197 mg/dL — ABNORMAL HIGH (ref <15)

## 2024-03-16 LAB — POC URINE PREG, ED: Preg Test, Ur: NEGATIVE

## 2024-03-16 LAB — SALICYLATE LEVEL: Salicylate Lvl: <7 mg/dL — ABNORMAL LOW (ref 7.0–30.0)

## 2024-03-16 MED ORDER — POTASSIUM CHLORIDE CRYS ER 20 MEQ PO TBCR
40.0000 meq | EXTENDED_RELEASE_TABLET | Freq: Once | ORAL | Status: AC
  Administered 2024-03-16: 40 meq via ORAL
  Filled 2024-03-16: qty 2

## 2024-03-16 MED ORDER — MIDAZOLAM HCL (PF) 2 MG/2ML IJ SOLN: 2.0000 mg | Freq: Once | INTRAMUSCULAR | Status: DC

## 2024-03-16 MED ORDER — OLANZAPINE 5 MG PO TABS
5.0000 mg | ORAL_TABLET | Freq: Two times a day (BID) | ORAL | Status: DC
  Administered 2024-03-16 – 2024-03-19 (×7): 5 mg via ORAL
  Filled 2024-03-16 (×8): qty 1

## 2024-03-16 MED ORDER — OLANZAPINE 10 MG IM SOLR: 5.0000 mg | Freq: Once | INTRAMUSCULAR | Status: DC

## 2024-03-16 NOTE — Consult Note (Cosign Needed Addendum)
 [facility] Psychiatric Consult Initial  Patient Name: .Jody Eaton  MRN: [id number]  DOB: January 01, 1981  Consult Order details:  Orders (From admission, onward)     Start     Ordered   03/16/24 0219  CONSULT TO CALL ACT TEAM       Ordering Provider: Waymond Lorelle Cummins, MD  Provider:  (Not yet assigned)  Question:  Reason for Consult?  Answer:  Psych consult   03/16/24 0218   03/16/24 0219  IP CONSULT TO PSYCHIATRY       Ordering Provider: Waymond Lorelle Cummins, MD  Provider:  (Not yet assigned)  Question:  Reason for consult:  Answer:  Medication management   03/16/24 0218             Mode of Visit: In person    Psychiatry Consult Evaluation  Service Date: March 16, 2024 LOS:  LOS: 0 days  Chief Complaint Schizoaffective bipolar type  Primary Psychiatric Diagnoses  Schizoaffective bipolar type 2.  Generalized anxiety disorder   Assessment  Jody Eaton is a 43 yJodyo. female admitted: Presented to the ED voluntarily on 03/16/2024  1:38 AM after being wheeled in to triage with multiple complaints, endorsing insomnia for several days and having a cough for over a month. Patient's son states that he has been drinking alcohol daily for the past 11 months and abusing marijuana, last drink was an hour prior to arriving to the ED. Patient's son endorses that he believes his mother is experiencing a manic episode and has been off her medications.   Per ED physician documentation: Jody Eaton is a 43 yJodyo. female with history of schizophrenia and bipolar disorder presenting with manic behavior.  Son who reported that she was having a manic episode, believes that she is not taking her medications.  Patient endorses alcohol use and marijuana use.  States that she has been having insomnia.  Also notes a cough for a month.  She denies any chest pain or shortness of breath at this time.  No other complaints.  Is willing to stay for psych.  No SI or HI.  Diagnoses:  Active Hospital  problems: Active Problems:   Schizoaffective bipolar type    Plan   ## Psychiatric Medication Recommendations:  - admit patient to inpatient adult BH bed if available and initiate Olanzapine  5 mg twice daily for psychosis.   ## Medical Decision Making Capacity: Not specifically addressed in this encounter  ## Further Work-up:  ---- Pertinent labwork reviewed earlier this admission includes: CBC, CMP, BAC, UDS, EKG, and Pregnancy test.    ## Disposition:-- We recommend inpatient psychiatric hospitalization after medical hospitalization. Patient has been involuntarily committed on 03/16/2024.   ## Behavioral / Environmental: -Patient would benefit from more frequent contact with medical team to delineate plan of care and allow for clarification questions, which will help alleviate anxiety regarding treatment. If possible, try to check back in with the pt in the afternoon.    ## Safety and Observation Level:  - Based on my clinical evaluation, I estimate the patient to be at low risk of self harm in the current setting. - At this time, we recommend  routine. This decision is based on my review of the chart including patient's history and current presentation, interview of the patient, mental status examination, and consideration of suicide risk including evaluating suicidal ideation, plan, intent, suicidal or self-harm behaviors, risk factors, and protective factors. This judgment is based on our ability to directly address  suicide risk, implement suicide prevention strategies, and develop a safety plan while the patient is in the clinical setting. Please contact our team if there is a concern that risk level has changed.  CSSR Risk Category:C-SSRS RISK CATEGORY: No Risk  Suicide Risk Assessment: Patient has following modifiable risk factors for suicide: recklessness, medication noncompliance, and recent psychiatric hospitalization, which we are addressing by utilizing therapeutic  conversation with patient about events leading up to ED presentation.  . Patient has following non-modifiable or demographic risk factors for suicide: psychiatric hospitalization Patient has the following protective factors against suicide: Access to outpatient mental health care and Supportive family  Thank you for this consult request. Recommendations have been communicated to the primary team.  We will continue to provide consultation at this time.   Jody JINNY Mountain, NP       History of Present Illness  Relevant Aspects of Hospital ED Course:   Patient is a 43 year-old, single, African-American, female with known history of Schizoaffective bipolar disorder and Polysubstance abuse disorder (alcohol and cannabis). She initially presented to the ED endorsing multiple complaints, including not sleeping for several nights and experiencing a cough for approximately a month. However, her son reported that his mother is currently manic and has been non-compliant with taking her medications.   Upon psychiatric evaluation this morning, patient is a limited historian secondary to present onset of acute mania and psychosis. Patient is elated, laughing, internally preoccupied, impulsive, tangent and disorganized and significantly talkative. She reports her sister has been residing in her home since January and has coerced her to engage in consuming large amounts, a fifth or more of Tequila and smoke cannabis daily. She reports that her sister should not be drinking like that and she is tired of this. Patient endorses she has been compliant with taking her psychotropic medications against the advice of her psychiatry provider who she calls an African man but she is unable to remember the provider's name or practice name. Patient has psychomotor agitation present and is moving around and pacing as she is providing her information. She relates she has not slept in a very long time and does not remember when  she last slept. Patient reports previously being hospitalized at [facility] earlier this for being manic, I guess. Per chart review she was taking Haloperidol  tablets, receiving Haloperidol  Decanoate LAI, Hydroxyzine , and Melatonin, which all have not been filled since last year. She reports having a good appetite. Patient denies current SI/HI/AVH at this time.     Psych ROS:  Depression: Denies Anxiety:  Denies Mania (lifetime and current): Current and previous episodes Psychosis: (lifetime and current): Current and previous episodes  Collateral information:  Contacted Markell Coufal at [phone] on 03/16/2024 by TTS.   Psychiatric and Social History  Psychiatric History:  Information collected from patient and chart  Prev Dx/Sx: Schizoaffective bipolar type Current Psych Provider: Unknown Home Meds (current): Haldol , Haldol  Decanoate LAI, Melatonin, and Hydroxyzine  Previous Med Trials: Same as above, others unknown at present Therapy: Reports past participation  Prior Psych Hospitalization: Previous admission at [facility]  Prior Self Harm: Unknown Prior Violence: Unknown  Family Psych History: Denies Family Hx suicide: Denies  Social History:  Educational Hx: Graduated high school Occupational Hx: Conservation Officer, [employer] Hx: Unknown Living Situation: Lives at home with sister Spiritual Hx: Christian Access to weapons/lethal means: Denies   Substance History Alcohol: Endorses daily use  Type of alcohol Tequilla Last Drink Yesterday Number of drinks per day  Fifth or more History of alcohol withdrawal seizures Denies History of DT's Denies Tobacco: Smokes 1 to 2 ppd Illicit drugs: Marijuana daily Prescription drug abuse: Denies Rehab hx: Denies  Exam Findings  Physical Exam: Deferred to EDP- note reviewed   Vital Signs:  Temp:  [98Jody7 F (37Jody1 C)-99 F (37Jody2 C)] 99 F (37Jody2 C) (11/08 0954) Pulse Rate:  [114-115] 114 (11/08 0954) Resp:  [20] 20 (11/08  0954) BP: (154-170)/(111-130) 170/115 (11/08 0954) SpO2:  [97 %-100 %] 97 % (11/08 0954) Weight:  [59 kg] 59 kg (11/08 0123) Blood pressure (!) 170/115, pulse (!) 114, temperature 99 F (37Jody2 C), temperature source Oral, resp. rate 20, height 5' 1 (1Jody549 m), weight 59 kg, last menstrual period 02/21/2024, SpO2 97%. Body mass index is 24Jody56 kg/m.  Physical Exam  Mental Status Exam: General Appearance: Disheveled and Fairly Groomed  Orientation:  Full (Time, Place, and Person)  Memory:  Immediate;   Fair Recent;   Fair Remote;   Fair  Concentration:  Concentration: Fair and Attention Span: Fair  Recall:  Fair  Attention  Poor  Eye Contact:  Fair  Speech:  Pressured and Loud  Language:  Good  Volume:  Increased  Mood: Elated, Labile  Affect:  Labile  Thought Process:  Disorganized and Irrelevant  Thought Content:  Delusions and Hallucinations: Command:  talking and laughing loudly inappropriately to herself  Suicidal Thoughts:  No  Homicidal Thoughts:  No  Judgement:  Impaired  Insight:  Lacking  Psychomotor Activity:  Increased  Akathisia:  No  Fund of Knowledge:  Fair      Assets:  Housing Physical Health Social Support Transportation  Cognition:  Impaired,  Moderate  ADL's:  Intact  AIMS (if indicated):        Other History   These have been pulled in through the EMR, reviewed, and updated if appropriate.  Family History:  The patient's family history is not on file.  Medical History: Past Medical History:  Diagnosis Date   Bipolar 1 disorder (HCC)    Schizophrenia (HCC)     Surgical History: No past surgical history on file.   Medications:   Current Facility-Administered Medications:    OLANZapine  (ZYPREXA ) tablet 5 mg, 5 mg, Oral, BID, Jeny Nield J, NP  Current Outpatient Medications:    haloperidol  (HALDOL ) 5 MG tablet, Take 1 tablet (5 mg total) by mouth daily., Disp: 30 tablet, Rfl: 0   haloperidol  decanoate (HALDOL  DECANOATE) 100 MG/ML  injection, Inject 1 mL (100 mg total) into the muscle every 30 (thirty) days. (Patient not taking: Reported on 03/07/2023), Disp: 1 mL, Rfl: 1   hydrOXYzine  (ATARAX ) 25 MG tablet, Take 25 mg by mouth 3 (three) times daily as needed., Disp: , Rfl:    melatonin 5 MG TABS, Take 0Jody5 tablets (2Jody5 mg total) by mouth at bedtime as needed., Disp: , Rfl:    meloxicam (MOBIC) 7Jody5 MG tablet, Take 7Jody5 mg by mouth daily., Disp: , Rfl:    methocarbamol (ROBAXIN) 500 MG tablet, Take 500 mg by mouth every 6 (six) hours as needed for muscle spasms., Disp: , Rfl:   Allergies: No Known Allergies  Jody JINNY Mountain, NP

## 2024-03-16 NOTE — BH Assessment (Signed)
 Pt referral re-faxed to the following facilities:  Destination  Service Provider Request Status Services Address Phone Fax Patient Preferred  [facility]  Pending - Request Jerel BIRKS [place] [address] [phone] --  [facility]  Pending - Request Sent -- [address] [place] [address] [phone] --  [facility]  Pending - Request Sent -- [address] [place] [address] [phone] --  [facility]  Pending - Request Sent -- [address] [place] [address] [phone] --  [facility]  Pending - Request Sent -- [address] [place] [address] [phone] --  [facility]  Pending - Request Sent -- [address], [place] [address] [phone] --  [facility]  Pending - Request Sent -- [address] [place] [address] [phone] --  [facility]  Pending - Request Sent -- [address] [place] [address] [phone] --  [facility] Patient Placement  Pending - Request Sent -- [facility] [place] [phone] --  [facility]  Pending - Request Sent -- [phone] [place] [address] [phone] --  [facility]  Pending - Request Sent -- [address] [place] [address] [phone] --  [facility]  Pending - Request Sent -- [address] [place] [address] [phone] --  [facility]  Pending - Request Sent -- [address] [place] [address] [phone] --  [facility]  Pending - Request Sent -- [address] [place] [address] [phone]  [phone] --  [facility] EFAX  Pending - Request Sent -- [place] [phone] --  [facility]  Pending - Request Sent -- [address], [place] [address] [phone] --

## 2024-03-16 NOTE — ED Triage Notes (Signed)
 Patient wheeled to triage with multiple complaints. Patient endorses not being able to sleep, having a cough x1 month. Son reports that mother is having a manic episode and believes she is not taking her medications. Patient endorses drinking alcohol everyday for the past 11 months and using marijuana. Last drink was tequila approx 1 hr ago.

## 2024-03-16 NOTE — ED Notes (Signed)
 Patient observed following another patient around day room.  Patient walking behind patient and changing directions as patient changed directions.  Patient instructed not to follow other patient as she was.  Patient then went to her room.

## 2024-03-16 NOTE — ED Notes (Signed)
 Patient is restless, repeatedly coming out of her room to stand and gesture in the hallway. After standing in the hallway and responding to internal stimuli for a time, she then sat on the bed in [facility]. When this NT got up to do a round at 0300 the patient stood and moved to stand extremely close and followed me around as I looked in each room, making occasional antagonizing gestures. This NT ignored her and she returned to sit on the [facility] bed. Once the patient's family in 23 opened their door she once again stood and began gesturing and posturing near him as he stood in the doorway, coming to stand close beside him with her arms folded.

## 2024-03-16 NOTE — ED Notes (Signed)
 This NT tried giving pt her breakfast tray. Pt states I'm not eating that, nope.

## 2024-03-16 NOTE — ED Notes (Signed)
 Vol/ Moved to [facility]- 2/ pending consult

## 2024-03-16 NOTE — ED Notes (Signed)
 Pt currently in her room laying in the bed, she was given a cup of vanilla ice cream for snack and being cooperative at this time

## 2024-03-16 NOTE — ED Notes (Signed)
 Pt dressed out into burgundy scrubs with this tech and [place], NT in the rm. Pt belongings consist of: a pair of black/white tennis shoes, black socks, red pants, black t-shirt, black durag, red panties, clear stone earrings (placed into specimen cup and placed in pt belongings bag), and a black bra. Pt belongings placed into ont pt belongings bag and labeled with pt name. Pt calm and cooperative while dressing out.

## 2024-03-16 NOTE — ED Provider Notes (Signed)
 [facility] Provider Note    Event Date/Time   First MD Initiated Contact with Patient 03/16/24 0143     (approximate)   History   Manic Behavior   HPI  Jody Eaton is a 43 y.o. female with history of schizophrenia and bipolar disorder presenting with manic behavior.  Son who reported that she was having a manic episode, believes that she is not taking her medications.  Patient endorses alcohol use and marijuana use.  States that she has been having insomnia.  Also notes a cough for a month.  She denies any chest pain or shortness of breath at this time.  No other complaints.  Is willing to stay for psych.  No SI or HI.  Independent history obtained from son as above.  On independent chart review, she was seen by psych in March when she presented with agitation and insomnia.  He was cleared by them for discharge.     Physical Exam   Triage Vital Signs: ED Triage Vitals [03/16/24 0123]  Encounter Vitals Group     BP (!) 162/130     Girls Systolic BP Percentile      Girls Diastolic BP Percentile      Boys Systolic BP Percentile      Boys Diastolic BP Percentile      Pulse Rate (!) 115     Resp 20     Temp 98.7 F (37.1 C)     Temp Source Oral     SpO2 100 %     Weight 130 lb (59 kg)     Height 5' 1 (1.549 m)     Head Circumference      Peak Flow      Pain Score 0     Pain Loc      Pain Education      Exclude from Growth Chart     Most recent vital signs: Vitals:   03/16/24 0125 03/16/24 0154  BP: (!) 154/111   Pulse:    Resp:    Temp:    SpO2:  100%     General: Awake, no distress.  CV:  Good peripheral perfusion.  Resp:  Normal effort.  Clear, no tachypnea or respiratory distress Abd:  No distention.  Soft nontender Other:  Calm   ED Results / Procedures / Treatments   Labs (all labs ordered are listed, but only abnormal results are displayed) Labs Reviewed  COMPREHENSIVE METABOLIC PANEL WITH GFR - Abnormal;  Notable for the following components:      Result Value   Potassium 2.9 (*)    Glucose, Bld 112 (*)    BUN <5 (*)    Calcium 8.6 (*)    Albumin 3.4 (*)    All other components within normal limits  ETHANOL - Abnormal; Notable for the following components:   Alcohol, Ethyl (B) 197 (*)    All other components within normal limits  CBC - Abnormal; Notable for the following components:   Platelets 444 (*)    All other components within normal limits  URINE DRUG SCREEN, QUALITATIVE ([facility] ONLY) - Abnormal; Notable for the following components:   Cannabinoid 50 Ng, Ur [place] POSITIVE (*)    All other components within normal limits  ACETAMINOPHEN  LEVEL - Abnormal; Notable for the following components:   Acetaminophen  (Tylenol ), Serum <10 (*)    All other components within normal limits  SALICYLATE LEVEL - Abnormal; Notable for the following components:   Salicylate  Lvl <7.0 (*)    All other components within normal limits  POC URINE PREG, ED       RADIOLOGY On my independent interpretation, chest x-ray without obvious consolidation   PROCEDURES:  Critical Care performed: No  Procedures   MEDICATIONS ORDERED IN ED: Medications  potassium chloride  SA (KLOR-CON  M) CR tablet 40 mEq (40 mEq Oral Given 03/16/24 0226)     IMPRESSION / MDM / ASSESSMENT AND PLAN / ED COURSE  I reviewed the triage vital signs and the nursing notes.                              Differential diagnosis includes, but is not limited to, cough appears chronic, could be viral, pneumonia, postnasal drip.  For her insomnia and reported mania, could be decompensated psych, medication noncompliance, substance use.  Will get labs, chest x-ray and plan to medically clear for psych.  Will keep her voluntary for now since patient is willing to stay.  Patient's presentation is most consistent with acute presentation with potential threat to life or bodily function.  Independent interpretation of labs and imaging  below.  She is medically cleared for psych.    Clinical Course as of 03/16/24 0254  Sat Mar 16, 2024  0253 Independent review of labs, she is hypokalemic, will replete, creatinine is normal, LFTs are normal, Tylenol , salicylate are not elevated, ethanol levels are elevated, she is positive for cannabinoids, no leukocytosis, pregnancy test is negative. [TT]  0253 DG Chest 1 View 1. No acute cardiopulmonary process detected.  [TT]    Clinical Course User Index [TT] Waymond Lorelle Cummins, MD     FINAL CLINICAL IMPRESSION(S) / ED DIAGNOSES   Final diagnoses:  Insomnia, unspecified type  Alcohol use  Hypokalemia  Cough, unspecified type     Rx / DC Orders   ED Discharge Orders     None        Note:  This document was prepared using Dragon voice recognition software and may include unintentional dictation errors.    Waymond Lorelle Cummins, MD 03/16/24 [phone]

## 2024-03-16 NOTE — BH Assessment (Signed)
 Patient was added to [facility] telepsyc waiting list via Secure chat due to [facility] phone system issues. [facility] confirmed via chat. Assessment currently pending

## 2024-03-16 NOTE — BH Assessment (Signed)
 Writer received call from Kia with [facility] who rescinded bed for patient due to medicaid not processing.

## 2024-03-16 NOTE — BH Assessment (Signed)
 Comprehensive Clinical Assessment (CCA) Note  03/16/2024 Jody Eaton [id number]  Chief Complaint: Patient is a 43 year old female presenting to [facility] ED voluntarily. Per triage note Patient wheeled to triage with multiple complaints. Patient endorses not being able to sleep, having a cough x1 month. Son reports that mother is having a manic episode and believes she is not taking her medications. Patient endorses drinking alcohol everyday for the past 11 months and using marijuana. Last drink was tequila approx 1 hr ago. During assessment patient appears alert and oriented x4, calm and cooperative, mood appears depressed. Patient reports feeling exhausted and tired, I haven't slept in I don't know how long. When asked if she was tired of living she denies I love life, I'm just drained. In terms of her appetite the patient cannot recall the last time she ate or the last time I had a bowel movement. Patient does report that she has been drinking daily for the last 11 months a 5th to 1/2 gallon of tequila, I'm usually drinking with my sister with whom she lives with. Patient does report that she takes her mental health medications but cannot recall where she sees her psychiatrist. Patient denies current SI/HI/AH/VH.  Chief Complaint  Patient presents with   Manic Behavior   Visit Diagnosis: Schizoaffective disorder, bipolar type     CCA Screening, Triage and Referral (STR)  Patient Reported Information How did you hear about us ? Family/Friend  Referral name: No data recorded Referral phone number: No data recorded  Whom do you see for routine medical problems? No data recorded Practice/Facility Name: No data recorded Practice/Facility Phone Number: No data recorded Name of Contact: No data recorded Contact Number: No data recorded Contact Fax Number: No data recorded Prescriber Name: No data recorded Prescriber Address (if known): No data recorded  What Is the Reason for Your  Visit/Call Today? Patient wheeled to triage with multiple complaints. Patient endorses not being able to sleep, having a cough x1 month. Son reports that mother is having a manic episode and believes she is not taking her medications. Patient endorses drinking alcohol everyday for the past 11 months and using marijuana. Last drink was tequila approx 1 hr ago  How Long Has This Been Causing You Problems? > than 6 months  What Do You Feel Would Help You the Most Today? Treatment for Depression or other mood problem; Alcohol or Drug Use Treatment   Have You Recently Been in Any Inpatient Treatment (Hospital/Detox/Crisis Center/28-Day Program)? No data recorded Name/Location of Program/Hospital:No data recorded How Long Were You There? No data recorded When Were You Discharged? No data recorded  Have You Ever Received Services From [facility] Before? No data recorded Who Do You See at [facility]? No data recorded  Have You Recently Had Any Thoughts About Hurting Yourself? No  Are You Planning to Commit Suicide/Harm Yourself At This time? No   Have you Recently Had Thoughts About Hurting Someone Sher[place]? No  Explanation: No data recorded  Have You Used Any Alcohol or Drugs in the Past 24 Hours? Yes  How Long Ago Did You Use Drugs or Alcohol? No data recorded What Did You Use and How Much? alcohol   Do You Currently Have a Therapist/Psychiatrist? Yes  Name of Therapist/Psychiatrist: Unable to remember the name.   Have You Been Recently Discharged From Any Office Practice or Programs? No  Explanation of Discharge From Practice/Program: No data recorded    CCA Screening Triage Referral Assessment Type of  Contact: Face-to-Face  Is this Initial or Reassessment? No data recorded Date Telepsych consult ordered in [facility]:  No data recorded Time Telepsych consult ordered in [facility]:  No data recorded  Patient Reported Information Reviewed? No data recorded Patient Left Without Being Seen?  No data recorded Reason for Not Completing Assessment: No data recorded  Collateral Involvement: Patient's sister   Does Patient Have a Court Appointed Legal Guardian? No data recorded Name and Contact of Legal Guardian: No data recorded If Minor and Not Living with Parent(s), Who has Custody? No data recorded Is CPS involved or ever been involved? Never  Is APS involved or ever been involved? Never   Patient Determined To Be At Risk for Harm To Self or Others Based on Review of Patient Reported Information or Presenting Complaint? No  Method: No data recorded Availability of Means: No data recorded Intent: No data recorded Notification Required: No data recorded Additional Information for Danger to Others Potential: No data recorded Additional Comments for Danger to Others Potential: No data recorded Are There Guns or Other Weapons in Your Home? No  Types of Guns/Weapons: No data recorded Are These Weapons Safely Secured?                            No data recorded Who Could Verify You Are Able To Have These Secured: No data recorded Do You Have any Outstanding Charges, Pending Court Dates, Parole/Probation? No data recorded Contacted To Inform of Risk of Harm To Self or Others: No data recorded  Location of Assessment: [facility] ED   Does Patient Present under Involuntary Commitment? No  IVC Papers Initial File Date: No data recorded  [place] of Residence: [place]   Patient Currently Receiving the Following Services: Medication Management   Determination of Need: Emergent (2 hours)   Options For Referral: ED Visit     CCA Biopsychosocial Intake/Chief Complaint:  No data recorded Current Symptoms/Problems: No data recorded  Patient Reported Schizophrenia/Schizoaffective Diagnosis in Past: No   Strengths: Patient is able to communicate her needs  Preferences: No data recorded Abilities: No data recorded  Type of Services Patient Feels are Needed: No data  recorded  Initial Clinical Notes/Concerns: No data recorded  Mental Health Symptoms Depression:  Change in energy/activity; Difficulty Concentrating; Fatigue; Hopelessness; Sleep (too much or little); Increase/decrease in appetite   Duration of Depressive symptoms: Greater than two weeks   Mania:  None   Anxiety:   Restlessness   Psychosis:  None   Duration of Psychotic symptoms: No data recorded  Trauma:  None   Obsessions:  None   Compulsions:  None   Inattention:  None   Hyperactivity/Impulsivity:  None   Oppositional/Defiant Behaviors:  None   Emotional Irregularity:  None   Other Mood/Personality Symptoms:  No data recorded   Mental Status Exam Appearance and self-care  Stature:  Average   Weight:  Average weight   Clothing:  Casual   Grooming:  Normal   Cosmetic use:  None   Posture/gait:  Normal   Motor activity:  Not Remarkable   Sensorium  Attention:  Normal   Concentration:  Normal   Orientation:  X5   Recall/memory:  Normal   Affect and Mood  Affect:  Appropriate   Mood:  Depressed   Relating  Eye contact:  Normal   Facial expression:  Depressed   Attitude toward examiner:  Cooperative   Thought and Language  Speech flow: Clear  and Coherent   Thought content:  Appropriate to Mood and Circumstances   Preoccupation:  None   Hallucinations:  None   Organization:  No data recorded  [employer] of Knowledge:  Fair   Intelligence:  Average   Abstraction:  Normal   Judgement:  Fair   Dance Movement Psychotherapist:  Adequate   Insight:  Fair   Decision Making:  Normal   Social Functioning  Social Maturity:  Isolates   Social Judgement:  Normal   Stress  Stressors:  Illness   Coping Ability:  Contractor Deficits:  None   Supports:  Family     Religion: Religion/Spirituality Are You A Religious Person?: No  Leisure/Recreation: Leisure / Recreation Do You Have Hobbies?:  No  Exercise/Diet: Exercise/Diet Do You Exercise?: No Have You Gained or Lost A Significant Amount of Weight in the Past Six Months?: No Do You Follow a Special Diet?: No Do You Have Any Trouble Sleeping?: Yes Explanation of Sleeping Difficulties: Patient cannot recall the last time she slept   CCA Employment/Education Employment/Work Situation: Employment / Work Situation Employment Situation: Employed Patient's Job has Been Impacted by Current Illness: No Has Patient ever Been in Equities Trader?: No  Education: Education Last Grade Completed: 11 Did You Product Manager?: Yes Did You Have An Individualized Education Program (IIEP): No Did You Have Any Difficulty At [employer]?: No   CCA Family/Childhood History Family and Relationship History: Family history Marital status: Single Does patient have children?: Yes How many children?: 1  Childhood History:  Childhood History Did patient suffer any verbal/emotional/physical/sexual abuse as a child?: No Did patient suffer from severe childhood neglect?: No Has patient ever been sexually abused/assaulted/raped as an adolescent or adult?: No Was the patient ever a victim of a crime or a disaster?: No Witnessed domestic violence?: No Has patient been affected by domestic violence as an adult?: No  Child/Adolescent Assessment:     CCA Substance Use Alcohol/Drug Use: Alcohol / Drug Use Pain Medications: See MAR Prescriptions: See MAR Over the Counter: See MAR History of alcohol / drug use?: Yes Longest period of sobriety (when/how long): Unable to quantify Negative Consequences of Use:  (Reports of none) Substance #1 Name of Substance 1: alcohol 1 - Age of First Use: unknown 1 - Amount (size/oz): 5th-1/2 gallon of tequila 1 - Frequency: daily 1 - Duration: 11 months 1 - Last Use / Amount: 03/16/24                       ASAM's:  Six Dimensions of Multidimensional Assessment  Dimension 1:  Acute Intoxication  and/or Withdrawal Potential:      Dimension 2:  Biomedical Conditions and Complications:      Dimension 3:  Emotional, Behavioral, or Cognitive Conditions and Complications:     Dimension 4:  Readiness to Change:     Dimension 5:  Relapse, Continued use, or Continued Problem Potential:     Dimension 6:  Recovery/Living Environment:     ASAM Severity Score:    ASAM Recommended Level of Treatment:     Substance use Disorder (SUD) Substance Use Disorder (SUD)  Checklist Symptoms of Substance Use: Continued use despite having a persistent/recurrent physical/psychological problem caused/exacerbated by use, Continued use despite persistent or recurrent social, interpersonal problems, caused or exacerbated by use, Evidence of tolerance, Persistent desire or unsuccessful efforts to cut down or control use, Presence of craving or strong urge to use, Recurrent use that results  in a failure to fulfill major role obligations (work, school, home), Social, occupational, recreational activities given up or reduced due to use, Large amounts of time spent to obtain, use or recover from the substance(s)  Recommendations for Services/Supports/Treatments:    DSM5 Diagnoses: Patient Active Problem List   Diagnosis Date Noted   Agitation 08/04/2023   Schizoaffective disorder, bipolar type (HCC) 03/07/2023   Insomnia 08/22/2022   Schizophrenia, acute undifferentiated (HCC) 07/11/2022   Bipolar 1 disorder, depressed, severe (HCC) 07/10/2022   Noncompliance 07/03/2017   Cannabis abuse 07/03/2017   Tobacco use disorder 05/02/2016   Bipolar I disorder (HCC) 05/02/2016    Patient Centered Plan: Patient is on the following Treatment Plan(s):  Depression   Referrals to Alternative Service(s): Referred to Alternative Service(s):   Place:   Date:   Time:    Referred to Alternative Service(s):   Place:   Date:   Time:    Referred to Alternative Service(s):   Place:   Date:   Time:    Referred to Alternative  Service(s):   Place:   Date:   Time:      @BHCOLLABOFCARE @  [employer], LCAS-A

## 2024-03-16 NOTE — ED Notes (Signed)
 Pt refusing po medications.

## 2024-03-16 NOTE — ED Notes (Signed)
Pt received dinner tray.

## 2024-03-16 NOTE — Consult Note (Signed)
 Patient has been faxed out per [facility]   Destination  Service Provider Request Status Services Address Phone Fax Patient Preferred  [facility]  Pending - Request Jerel BIRKS [place] [address] [phone] --  [facility]  Pending - Request Sent -- [address] [place] [address] [phone] --  [facility]  Pending - Request Sent -- [address] [place] [address] [phone] --  [facility]  Pending - Request Sent -- [address] [place] [address] [phone] --  [facility]  Pending - Request Sent -- [address] [place] [address] [phone] --  [facility]  Pending - Request Sent -- [address], [place] [address] [phone] --  [facility]  Pending - Request Sent -- [address] [place] [address] [phone] --  [facility]  Pending - Request Sent -- [address] [place] [address] [phone] --

## 2024-03-16 NOTE — ED Provider Notes (Signed)
 Patient has been refusing medications.  She has acute psychiatric condition is currently awaiting psychiatric admission.  Nursing advising patient becoming agitated, pacing, aggressive towards other persons in the staff room.  She is refusing medication.  She is supposed to be receiving Zyprexa  scheduled.  For patient's safety and due to concerns of acute psychosis I have ordered intramuscular Zyprexa  and also intramuscular midazolam.  Dawn, RN, will monitor patient closely   Dicky Anes, MD 03/16/24 2002

## 2024-03-16 NOTE — BH Assessment (Deleted)
 Patient has been accepted to [facility].  Patient assigned to [facility]  Accepting physician is Dr. Jess.  Call report to [phone].  Representative was Kia D.   ER Staff is aware of it:  Lyndy EDISON ER Secretary  Dr. Oneil RODES, ER MD  Delon ORN Patient's Nurse  Address: [phone] [place], [place] [address]

## 2024-03-16 NOTE — ED Notes (Signed)
 Pt given warm blanket.

## 2024-03-17 NOTE — ED Notes (Signed)
Pt refused snacks.

## 2024-03-17 NOTE — ED Notes (Addendum)
lunch tray provided to pt.

## 2024-03-17 NOTE — ED Notes (Signed)
 Patient is vol pending placement

## 2024-03-17 NOTE — ED Notes (Signed)
 Pt pacing dayroom, pt is not redirectable, pt shakes her head when asked if she had any needs at this time

## 2024-03-17 NOTE — ED Provider Notes (Signed)
 Emergency Medicine Observation Re-evaluation Note  Jody Eaton is a 43 y.o. female, seen on rounds today.  Pt initially presented to the ED for complaints of Manic Behavior  Currently, the patient is resting comfortably.  Physical Exam  BP (!) 172/102 (BP Location: Left Arm)   Pulse (!) 128   Temp 98.4 F (36.9 C) (Oral)   Resp 19   Ht 5' 1 (1.549 m)   Wt 59 kg   LMP 02/21/2024 (Approximate)   SpO2 97%   BMI 24.56 kg/m  General: No acute distress Cardiac: Well-perfused extremities Lungs: No respiratory distress Psych: Appropriate mood and affect  ED Course / MDM  EKG:   I have reviewed the labs performed to date as well as medications administered while in observation.  Recent changes in the last 24 hours include none.  Plan  Current plan is for placement.   Jody Artist POUR, MD 03/17/24 [phone]

## 2024-03-17 NOTE — ED Notes (Signed)
VOL/pending placement 

## 2024-03-17 NOTE — ED Notes (Signed)
 Dinner tray provided to pt

## 2024-03-18 NOTE — ED Notes (Signed)
 Meal provided

## 2024-03-18 NOTE — ED Notes (Signed)
VOL/Pending Placement 

## 2024-03-18 NOTE — ED Provider Notes (Signed)
 Emergency Medicine Observation Re-evaluation Note  Jody Eaton is a 43 y.o. female, seen on rounds today.  Pt initially presented to the ED for complaints of Manic Behavior  Currently, the patient is no acute distress.  No issues overnight per BHU nurse  Physical Exam  Blood pressure (!) 174/103, pulse (!) 114, temperature 99.7 F (37.6 C), temperature source Oral, resp. rate 18, height 5' 1 (1.549 m), weight 59 kg, last menstrual period 02/21/2024, SpO2 94%.  Physical Exam General: No apparent distress Pulm: Normal WOB Psych: resting     ED Course / MDM  No new updates   Plan   Current plan is to continue to wait for psych plan/placement if felt warranted  Patient is not under full IVC at this time.   Ernest Ronal BRAVO, MD 03/18/24 [phone]

## 2024-03-18 NOTE — ED Notes (Signed)
Pt was given water. 

## 2024-03-18 NOTE — ED Notes (Signed)
Pt was given night time snack

## 2024-03-18 NOTE — ED Notes (Signed)
Vol /pending placement 

## 2024-03-18 NOTE — Progress Notes (Signed)
  Per [facility], there are no appropriated beds available within [facility] system. Patient was re-faxed to the following facilities:   Service Provider Phone  [facility]  [phone]  [facility]  [phone]  [facility]  [phone]  [facility]  [phone]  [facility]  [phone]  [facility]  [phone]  [facility]  [phone]  [facility]  [phone]  [facility] Patient Placement  [phone]  [facility]  [phone]  [facility]  [phone]  [facility]  [phone]  [facility]  [phone]  [facility]  [phone]  [facility] EFAX  [phone]  [facility]  [address] [place] [phone]

## 2024-03-18 NOTE — ED Notes (Signed)
 Pt given dinner tray and water. Pt has no needs at this time.

## 2024-03-18 NOTE — ED Notes (Signed)
 ED RN taking over care of pt at this time after receiving handoff. Pt ABCs intact. RR even and unlabored. Pt in NAD. Bed in lowest locked position. Pt being observed in BHU at this time.   Past Medical History:  Diagnosis Date   Bipolar 1 disorder (HCC)    Schizophrenia (HCC)

## 2024-03-18 NOTE — ED Notes (Signed)
Patient awake

## 2024-03-18 NOTE — Progress Notes (Signed)
   03/18/24 1030  Spiritual Encounters  Type of Visit Initial  Care provided to: Patient  Conversation partners present during encounter Nurse  Reason for visit Routine spiritual support  OnCall Visit No   Chaplain visited with patient while rounding on the Unit.  When Chaplain asked if there were any spiritual care needs, the patient requested prayer.  Patient shared that she's on the Unit because her mind was racing so much, she couldn't get any sleep and felt she needed rest.  Chaplain celebrated patient's awareness that she needed to take this time for herself and her health.  Patient was tearful about her children and that she couldn't see them.  Chaplain prayed with patient about the patient's children and her finances.    Rev. Rana M. Nicholaus, M.Div. Chaplain Resident [facility]

## 2024-03-19 NOTE — Progress Notes (Signed)
 Insurance has been a barrier for placement.  Referred patient over to facilities that do not require insurance for placement. Patient currently under review with [facility].    [place], [facility] [phone]

## 2024-03-19 NOTE — ED Notes (Signed)
 Pt up to restroom. No other needs at this time.

## 2024-03-19 NOTE — ED Notes (Signed)
 Meal tray provided. Tray checked for potential hazards. Pt denies no other needs at this time.

## 2024-03-19 NOTE — ED Notes (Signed)
Snack and water provided  

## 2024-03-19 NOTE — ED Notes (Signed)
 Pt given snack and drink at this time. Pt is well appearing. Pt denies no other needs at this time.

## 2024-03-19 NOTE — ED Notes (Addendum)
 Pt ABCs intact. RR even and unlabored. Pt in NAD. Bed in lowest locked position. Call bell in reach. Denies needs at this time.    Past Medical History:  Diagnosis Date   Bipolar 1 disorder (HCC)    Schizophrenia [facility])       Patient has been accepted to [facility] on tomm 03/20/24.  Patient was not assigned to room. Accepting physician is Dr. Sherwood Rummer.  Call report to [phone].  Representative was [employer].

## 2024-03-19 NOTE — ED Notes (Signed)
 Pt up to restroom. Requesting a cup of water. It was provided.

## 2024-03-19 NOTE — BH Assessment (Signed)
 Patient has been accepted to [facility] on tomm 03/20/24.  Patient was not assigned to room. Accepting physician is Dr. Sherwood Rummer.  Call report to [phone].  Representative was [employer].   ER Staff is aware of it:  Luann, ER Secretary  Dr. Ernest, ER MD  [facility] Patient's Nurse

## 2024-03-20 NOTE — ED Notes (Signed)
 Meal provided

## 2024-03-20 NOTE — ED Notes (Signed)
 Pt up to the restroom. No other needs at this time.

## 2024-03-20 NOTE — ED Provider Notes (Signed)
@  [facility]@  Blood pressure (!) 163/99, pulse 95, temperature 98.3 F (36.8 C), temperature source Oral, resp. rate 20, height 5' 1 (1.549 m), weight 59 kg, last menstrual period 02/21/2024, SpO2 99%.  The patient is calm and cooperative at this time.  There have been no acute events since the last update. Accepted to [facility], pending transport.    Fernand Rossie HERO, MD 03/20/24 [phone]

## 2024-03-20 NOTE — ED Notes (Addendum)
 Report given to [facility] RN

## 2024-03-20 NOTE — ED Notes (Signed)
 [facility] called for Transport to [facility]

## 2024-03-20 NOTE — ED Notes (Signed)
 Pt up to restroom. Pt requesting water. Pt given water and instructed that this would be the last one until breakfast was delivered.

## 2024-03-20 NOTE — ED Notes (Signed)
 EMTALA reviewed by [facility], RN
